# Patient Record
Sex: Female | Born: 1965 | Race: White | Hispanic: No | Marital: Married | Smoking: Former smoker
Health system: Southern US, Community
[De-identification: ages and names within clinical notes are randomized; demographics above are authoritative.]

## PROBLEM LIST (undated history)

## (undated) DIAGNOSIS — G43909 Migraine, unspecified, not intractable, without status migrainosus: Secondary | ICD-10-CM

## (undated) DIAGNOSIS — R519 Headache, unspecified: Secondary | ICD-10-CM

## (undated) DIAGNOSIS — K219 Gastro-esophageal reflux disease without esophagitis: Secondary | ICD-10-CM

## (undated) DIAGNOSIS — R51 Headache: Secondary | ICD-10-CM

## (undated) DIAGNOSIS — C50411 Malignant neoplasm of upper-outer quadrant of right female breast: Secondary | ICD-10-CM

## (undated) DIAGNOSIS — I10 Essential (primary) hypertension: Secondary | ICD-10-CM

## (undated) HISTORY — PX: WISDOM TOOTH EXTRACTION: SHX21

## (undated) HISTORY — PX: TUBAL LIGATION: SHX77

## (undated) HISTORY — DX: Migraine, unspecified, not intractable, without status migrainosus: G43.909

## (undated) HISTORY — DX: Malignant neoplasm of upper-outer quadrant of right female breast: C50.411

---

## 2009-11-11 HISTORY — PX: CHOLECYSTECTOMY: SHX55

## 2015-11-12 HISTORY — PX: HERNIA REPAIR: SHX51

## 2016-05-01 ENCOUNTER — Other Ambulatory Visit: Payer: Self-pay | Admitting: General Surgery

## 2016-05-01 DIAGNOSIS — C50211 Malignant neoplasm of upper-inner quadrant of right female breast: Secondary | ICD-10-CM

## 2016-05-03 ENCOUNTER — Encounter (HOSPITAL_COMMUNITY): Payer: Self-pay | Admitting: Oncology

## 2016-05-03 ENCOUNTER — Other Ambulatory Visit: Payer: Self-pay | Admitting: General Surgery

## 2016-05-03 DIAGNOSIS — C50411 Malignant neoplasm of upper-outer quadrant of right female breast: Secondary | ICD-10-CM

## 2016-05-03 DIAGNOSIS — C50211 Malignant neoplasm of upper-inner quadrant of right female breast: Secondary | ICD-10-CM | POA: Insufficient documentation

## 2016-05-03 HISTORY — DX: Malignant neoplasm of upper-outer quadrant of right female breast: C50.411

## 2016-05-07 ENCOUNTER — Encounter (HOSPITAL_BASED_OUTPATIENT_CLINIC_OR_DEPARTMENT_OTHER): Payer: Self-pay | Admitting: *Deleted

## 2016-05-09 ENCOUNTER — Other Ambulatory Visit: Payer: Self-pay | Admitting: Radiology

## 2016-05-10 NOTE — Pre-Procedure Instructions (Signed)
Boost Breeze given to patients family member with instructions.

## 2016-05-13 ENCOUNTER — Encounter (HOSPITAL_COMMUNITY)
Admission: RE | Admit: 2016-05-13 | Discharge: 2016-05-13 | Disposition: A | Discharge status: Home / Self Care | Payer: BLUE CROSS/BLUE SHIELD | Source: Ambulatory Visit | Attending: General Surgery | Admitting: General Surgery

## 2016-05-13 ENCOUNTER — Encounter (HOSPITAL_COMMUNITY): Payer: BLUE CROSS/BLUE SHIELD | Attending: Hematology & Oncology | Admitting: Hematology & Oncology

## 2016-05-13 VITALS — BP 156/82 | HR 71 | Temp 98.4°F | Resp 16 | Wt 198.0 lb

## 2016-05-13 DIAGNOSIS — Z170 Estrogen receptor positive status [ER+]: Secondary | ICD-10-CM | POA: Diagnosis not present

## 2016-05-13 DIAGNOSIS — C50211 Malignant neoplasm of upper-inner quadrant of right female breast: Secondary | ICD-10-CM

## 2016-05-13 DIAGNOSIS — C50911 Malignant neoplasm of unspecified site of right female breast: Secondary | ICD-10-CM

## 2016-05-13 NOTE — Patient Instructions (Signed)
Rachael Villa at [facility] Discharge Instructions  RECOMMENDATIONS MADE BY THE CONSULTANT AND ANY TEST RESULTS WILL BE SENT TO YOUR REFERRING PHYSICIAN.  Exam done and seen today by Dr. Whitney Muse Return to see the doctor in about three weeks Please call the clinic if you have any questions or concerns  Thank you for choosing [place] at [facility] to provide your oncology and hematology care.  To afford each patient quality time with our provider, please arrive at least 15 minutes before your scheduled appointment time.   Beginning January 23rd 2017 lab work for the [facility] will be done in the  Main lab at [facility] on 1st floor. If you have a lab appointment with the [facility] please come in thru the  Main Entrance and check in at the main information desk  You need to re-schedule your appointment should you arrive 10 or more minutes late.  We strive to give you quality time with our providers, and arriving late affects you and other patients whose appointments are after yours.  Also, if you no show three or more times for appointments you may be dismissed from the clinic at the providers discretion.     Again, thank you for choosing [facility].  Our hope is that these requests will decrease the amount of time that you wait before being seen by our physicians.       _____________________________________________________________  Should you have questions after your visit to [facility], please contact our office at [phone] between the hours of 8:30 a.m. and 4:30 p.m.  Voicemails left after 4:30 p.m. will not be returned until the following business day.  For prescription refill requests, have your pharmacy contact our office.         Resources For Cancer Patients and their Caregivers ? American Cancer Society: Can assist with transportation, wigs, general needs, runs Look Good Feel Better.         539-780-6708 ? Cancer Care: Provides financial assistance, online support groups, medication/co-pay assistance.  1-800-813-HOPE 236-221-8035) ? [facility] Assists [place] cancer patients and their families through emotional , educational and financial support.  (207) 257-6112 ? [place] DSS Where to apply for food stamps, Medicaid and utility assistance. 905-852-8481 ? [employer]: Transportation to medical appointments. 616-758-6165 ? Social Security Administration: May apply for disability if have a Stage IV cancer. 515-888-9367 810-539-3048 ? [employer], Disability and Transit Services: Assists with nutrition, care and transit needs. [place] Support Programs: @10RELATIVEDAYS @ > Cancer Support Group  2nd Tuesday of the month 1pm-2pm, Journey Room  > Creative Journey  3rd Tuesday of the month 1130am-1pm, Journey Room  > Look Good Feel Better  1st Wednesday of the month 10am-12 noon, Journey Room (Call [place] to register [phone])

## 2016-05-13 NOTE — Progress Notes (Signed)
[facility]  CONSULT NOTE  Patient Care Team: No Pcp Per Patient as PCP - General (General Practice)  CHIEF COMPLAINTS/PURPOSE OF CONSULTATION:  Right breast cancer, ER+ PR+ HER-2(-)     Breast cancer of upper-outer quadrant of right female breast ([place])   03/28/2016 Mammogram 1.9 cm irregular spiculated mass in the upper inner quadrant of right breast.   04/19/2016 Pathology Results [facility] [facility] pathology-infiltrating ductal carcinoma of right breast.   05/03/2016 Pathology Results [facility] pathology consult- Consult [place] , right breast biopsy - INVASIVE DUCTAL CARCINOMA. ER (95%, strong staining intensity) and PR (95%, strong staining intensity). Her2 is negative (1+). Ki-67 reveals a proliferation rate of 30% and p53 is negative.    HISTORY OF PRESENTING ILLNESS:  Rachael Villa 50 y.o. female is here because of Right breast cancer, ER+ PR+ HER-2(-).  [name] is accompanied by her mother. I personally reviewed and went over pathology results with the patient.   She is scheduled for Right breast lumpectomy and sentinel node biopsy with Dr. Donne Hazel on Wednesday, 7/5. She is very happy with her experience with Dr. Donne Hazel.   She did not self palpate the mass in her breast, it was found during her screening mammogram. She is up to date on pap smears. She is pre-menopausal with erratic periods. There is no family history of breast cancer that she is aware of.  States her anxiety has all come from the numbing after having her biopsy done in [place]. Stating that it was very uncomfortable. When she had a biopsy here it was much better. Her only anxiety left is knowing they'll have to apply the numbing agent again, although she is not nervous about the surgery as a whole.   She denies any problems at her biopsy site, just a little tender.   Reports abdominal tenderness from previous hernia surgery.   She does not recall if her physician spoke with her about genetic testing.    She has already scheduled a consultation visit with radiation oncology in [place] on 7/18.  She will be due for a colonoscopy soon. Her last colonoscopy did not find any polyps.  The patient is here for further evaluation and discussion of newly diagnosed Right breast cancer and additional treatment options.  MEDICAL HISTORY:  Past Medical History  Diagnosis Date  . Breast cancer of upper-outer quadrant of right female breast ([place]) 05/03/2016  . Hypertension   . Headache     while on BCP  . GERD (gastroesophageal reflux disease)     less since gallbladder removed    SURGICAL HISTORY: Past Surgical History  Procedure Laterality Date  . Cholecystectomy  2011  . Hernia repair  2017    UHR  . Wisdom tooth extraction    . Tubal ligation      SOCIAL HISTORY: Social History   Social History  . Marital Status: Married    Spouse Name: N/A  . Number of Children: N/A  . Years of Education: N/A   Occupational History  . Not on file.   Social History Main Topics  . Smoking status: Former Research scientist (life sciences)  . Smokeless tobacco: Not on file  . Alcohol Use: Yes     Comment: social  . Drug Use: No  . Sexual Activity: Yes    Birth Control/ Protection: Surgical     Comment: TL   Other Topics Concern  . 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  Mother is still living and mostly healthy. Father deceased at 35 yo of colon cancer and related issues.  1 sibling No other history of breast cancer in the family that she is aware of.   ALLERGIES:  has No Known Allergies.  MEDICATIONS:  Current Outpatient Prescriptions  Medication Sig Dispense Refill  . cetirizine (ZYRTEC) 10 MG tablet Take 10 mg by  mouth daily.    Marland Kitchen losartan (COZAAR) 50 MG tablet Take 50 mg by mouth daily.    . Probiotic Product (PROBIOTIC ADVANCED PO) Take by mouth.     No current facility-administered medications for this visit.    Review of Systems  Constitutional: Negative.   HENT: Negative.   Eyes: Negative.   Respiratory: Negative.   Cardiovascular: Negative.   Gastrointestinal: Positive for abdominal pain (secondary to hernia surgery).  Genitourinary: Negative.   Musculoskeletal: Negative.   Skin: Negative.   Neurological: Negative.   Endo/Heme/Allergies: Negative.   Psychiatric/Behavioral: Negative.   All other systems reviewed and are negative. 14 point ROS was done and is otherwise as detailed above or in HPI   PHYSICAL EXAMINATION: ECOG PERFORMANCE STATUS: 1 - Symptomatic but completely ambulatory  Filed Vitals:   05/13/16 1351  BP: 156/82  Pulse: 71  Temp: 98.4 F (36.9 C)  Resp: 16   Filed Weights   05/13/16 1351  Weight: 198 lb (89.812 kg)     Physical Exam  Constitutional: She is oriented to person, place, and time and well-developed, well-nourished, and in no distress.  HENT:  Head: Normocephalic and atraumatic.  Nose: Nose normal.  Mouth/Throat: Oropharynx is clear and moist. No oropharyngeal exudate.  Eyes: Conjunctivae and EOM are normal. Pupils are equal, round, and reactive to light. Right eye exhibits no discharge. Left eye exhibits no discharge. No scleral icterus.  Neck: Normal range of motion. Neck supple. No tracheal deviation present. No thyromegaly present.  Cardiovascular: Normal rate, regular rhythm and normal heart sounds.  Exam reveals no gallop and no friction rub.   No murmur heard. Pulmonary/Chest: Effort normal and breath sounds normal. She has no wheezes. She has no rales.    Abdominal: Soft. Bowel sounds are normal. She exhibits no distension and no mass. There is tenderness. There is no rebound and no guarding.  Midline incision above the umbilicus -  tender with palpation  Musculoskeletal: Normal range of motion. She exhibits no edema.  Lymphadenopathy:    She has no cervical adenopathy.  Neurological: She is alert and oriented to person, place, and time. She has normal reflexes. No cranial nerve deficit. Gait normal. Coordination normal.  Skin: Skin is warm and dry. No rash noted.  Psychiatric: Mood, memory, affect and judgment normal.  Nursing note and vitals reviewed.   LABORATORY DATA:  I have reviewed the data as listed No results found for: WBC, HGB, HCT, MCV, PLT CMP  No results found for: NA, K, CL, CO2, GLUCOSE, BUN, CREATININE, CALCIUM, PROT, ALBUMIN, AST, ALT, ALKPHOS, BILITOT, GFRNONAA, GFRAA   RADIOGRAPHIC STUDIES: I have personally reviewed the radiological images as listed and agreed with the findings in the report. No results found.  PATHOLOGY   ASSESSMENT & PLAN:  Right breast cancer, ER+ PR+ HER-2(-)  Pre-Menopausal  The patient  is here for further evaluation and discussion of newly diagnosed right breast cancer and treatment options.I personally reviewed and went over pathology results at length with the patient. The patient was given an NCCN guidelines informational booklet about breast cancer. I reviewed this booklet with the patient and her mother. We discussed ER+ PR+ breast cancer in detail. HER 2 is pending. I spent time addressing the patient's and her mothers questions regarding staging and prognosis. We discussed the importance of final surgical pathology.  She is pre-menopausal with erratic periods. We briefly addressed the use of Tamoxifen as initial endocrine therapy.   I will send for oncotype testing once surgical pathology is available.   She is scheduled for Right breast lumpectomy and sentinel node biopsy with Dr. Donne Hazel on Wednesday, 7/5. She has already scheduled a consultation visit with radiation oncology in [place] on 7/18.  She will return for follow up on 7/26, 3 weeks after her  lumpectomy. She was instructed to call with any questions or concerns prior to scheduled follow-up.  All questions were answered. The patient knows to call the clinic with any problems, questions or concerns.  This document serves as a record of services personally performed by Ancil Linsey, MD. It was created on her behalf by Arlyce Harman, a trained medical scribe. The creation of this record is based on the scribe's personal observations and the provider's statements to them. This document has been checked and approved by the attending provider.  I have reviewed the above documentation for accuracy and completeness, and I agree with the above.  This note was electronically signed.    Molli Hazard, MD  05/13/2016 2:30 PM

## 2016-05-15 ENCOUNTER — Encounter (HOSPITAL_BASED_OUTPATIENT_CLINIC_OR_DEPARTMENT_OTHER): Payer: Self-pay | Admitting: *Deleted

## 2016-05-15 ENCOUNTER — Encounter: Payer: Self-pay | Admitting: Hematology & Oncology

## 2016-05-15 ENCOUNTER — Ambulatory Visit (HOSPITAL_BASED_OUTPATIENT_CLINIC_OR_DEPARTMENT_OTHER): Payer: BLUE CROSS/BLUE SHIELD | Admitting: Anesthesiology

## 2016-05-15 ENCOUNTER — Encounter (HOSPITAL_BASED_OUTPATIENT_CLINIC_OR_DEPARTMENT_OTHER)
Admission: RE | Disposition: A | Discharge status: Home / Self Care | Payer: Self-pay | Source: Ambulatory Visit | Attending: General Surgery

## 2016-05-15 ENCOUNTER — Encounter (HOSPITAL_BASED_OUTPATIENT_CLINIC_OR_DEPARTMENT_OTHER)
Admission: RE | Admit: 2016-05-15 | Discharge: 2016-05-15 | Disposition: A | Discharge status: Home / Self Care | Payer: BLUE CROSS/BLUE SHIELD | Source: Ambulatory Visit | Attending: General Surgery | Admitting: General Surgery

## 2016-05-15 ENCOUNTER — Ambulatory Visit (HOSPITAL_BASED_OUTPATIENT_CLINIC_OR_DEPARTMENT_OTHER)
Admission: RE | Admit: 2016-05-15 | Discharge: 2016-05-15 | Disposition: A | Discharge status: Home / Self Care | Payer: BLUE CROSS/BLUE SHIELD | Source: Ambulatory Visit | Attending: General Surgery | Admitting: General Surgery

## 2016-05-15 DIAGNOSIS — N6011 Diffuse cystic mastopathy of right breast: Secondary | ICD-10-CM | POA: Diagnosis not present

## 2016-05-15 DIAGNOSIS — C50211 Malignant neoplasm of upper-inner quadrant of right female breast: Secondary | ICD-10-CM | POA: Diagnosis not present

## 2016-05-15 DIAGNOSIS — C50911 Malignant neoplasm of unspecified site of right female breast: Secondary | ICD-10-CM | POA: Insufficient documentation

## 2016-05-15 DIAGNOSIS — C773 Secondary and unspecified malignant neoplasm of axilla and upper limb lymph nodes: Secondary | ICD-10-CM | POA: Insufficient documentation

## 2016-05-15 HISTORY — DX: Headache, unspecified: R51.9

## 2016-05-15 HISTORY — DX: Gastro-esophageal reflux disease without esophagitis: K21.9

## 2016-05-15 HISTORY — DX: Headache: R51

## 2016-05-15 HISTORY — DX: Essential (primary) hypertension: I10

## 2016-05-15 HISTORY — PX: RADIOACTIVE SEED GUIDED PARTIAL MASTECTOMY WITH AXILLARY SENTINEL LYMPH NODE BIOPSY: SHX6520

## 2016-05-15 SURGERY — RADIOACTIVE SEED GUIDED PARTIAL MASTECTOMY WITH AXILLARY SENTINEL LYMPH NODE BIOPSY
Anesthesia: General | Site: Breast | Laterality: Right

## 2016-05-15 MED ORDER — METOCLOPRAMIDE HCL 5 MG/ML IJ SOLN
10.0000 mg | Freq: Once | INTRAMUSCULAR | Status: DC | PRN
Start: 2016-05-15 — End: 2016-05-15

## 2016-05-15 MED ORDER — HYDROCODONE-ACETAMINOPHEN 10-325 MG PO TABS: 1.0000 | ORAL_TABLET | Freq: Four times a day (QID) | ORAL | Status: AC | PRN

## 2016-05-15 MED ORDER — SODIUM CHLORIDE 0.9 % IJ SOLN
INTRAMUSCULAR | Status: AC
  Filled 2016-05-15: qty 10

## 2016-05-15 MED ORDER — DEXAMETHASONE SODIUM PHOSPHATE 10 MG/ML IJ SOLN
INTRAMUSCULAR | Status: AC
  Filled 2016-05-15: qty 1

## 2016-05-15 MED ORDER — ACETAMINOPHEN 500 MG PO TABS
ORAL_TABLET | ORAL | Status: AC
  Filled 2016-05-15: qty 2

## 2016-05-15 MED ORDER — LIDOCAINE HCL (CARDIAC) 20 MG/ML IV SOLN
INTRAVENOUS | Status: DC | PRN
  Administered 2016-05-15: 70 mg via INTRAVENOUS

## 2016-05-15 MED ORDER — FENTANYL CITRATE (PF) 100 MCG/2ML IJ SOLN
25.0000 ug | INTRAMUSCULAR | Status: DC | PRN
  Administered 2016-05-15: 50 ug via INTRAVENOUS

## 2016-05-15 MED ORDER — CEFAZOLIN SODIUM-DEXTROSE 2-4 GM/100ML-% IV SOLN
2.0000 g | INTRAVENOUS | Status: AC
  Administered 2016-05-15: 2 g via INTRAVENOUS

## 2016-05-15 MED ORDER — SCOPOLAMINE 1 MG/3DAYS TD PT72: 1.0000 | MEDICATED_PATCH | Freq: Once | TRANSDERMAL | Status: DC | PRN

## 2016-05-15 MED ORDER — MIDAZOLAM HCL 2 MG/2ML IJ SOLN
INTRAMUSCULAR | Status: AC
  Filled 2016-05-15: qty 2

## 2016-05-15 MED ORDER — FENTANYL CITRATE (PF) 100 MCG/2ML IJ SOLN
INTRAMUSCULAR | Status: AC
  Filled 2016-05-15: qty 2

## 2016-05-15 MED ORDER — GABAPENTIN 300 MG PO CAPS
300.0000 mg | ORAL_CAPSULE | Freq: Once | ORAL | Status: AC
  Administered 2016-05-15: 300 mg via ORAL

## 2016-05-15 MED ORDER — ACETAMINOPHEN 500 MG PO TABS
1000.0000 mg | ORAL_TABLET | Freq: Once | ORAL | Status: AC
  Administered 2016-05-15: 1000 mg via ORAL

## 2016-05-15 MED ORDER — HEMOSTATIC AGENTS (NO CHARGE) OPTIME
TOPICAL | Status: DC | PRN
  Administered 2016-05-15: 1 via TOPICAL

## 2016-05-15 MED ORDER — LACTATED RINGERS IV SOLN
INTRAVENOUS | Status: DC
  Administered 2016-05-15: 15:00:00 via INTRAVENOUS

## 2016-05-15 MED ORDER — PROPOFOL 10 MG/ML IV BOLUS
INTRAVENOUS | Status: AC
  Filled 2016-05-15: qty 20

## 2016-05-15 MED ORDER — CEFAZOLIN SODIUM-DEXTROSE 2-4 GM/100ML-% IV SOLN
INTRAVENOUS | Status: AC
  Filled 2016-05-15: qty 100

## 2016-05-15 MED ORDER — GABAPENTIN 300 MG PO CAPS
ORAL_CAPSULE | ORAL | Status: AC
  Filled 2016-05-15: qty 1

## 2016-05-15 MED ORDER — BUPIVACAINE-EPINEPHRINE (PF) 0.5% -1:200000 IJ SOLN
INTRAMUSCULAR | Status: DC | PRN
  Administered 2016-05-15: 30 mL

## 2016-05-15 MED ORDER — CELECOXIB 400 MG PO CAPS
400.0000 mg | ORAL_CAPSULE | Freq: Once | ORAL | Status: AC
  Administered 2016-05-15: 400 mg via ORAL

## 2016-05-15 MED ORDER — BUPIVACAINE HCL (PF) 0.25 % IJ SOLN
INTRAMUSCULAR | Status: DC | PRN
  Administered 2016-05-15: 8 mL

## 2016-05-15 MED ORDER — MEPERIDINE HCL 25 MG/ML IJ SOLN: 6.2500 mg | INTRAMUSCULAR | Status: DC | PRN

## 2016-05-15 MED ORDER — GLYCOPYRROLATE 0.2 MG/ML IJ SOLN: 0.2000 mg | Freq: Once | INTRAMUSCULAR | Status: DC | PRN

## 2016-05-15 MED ORDER — TECHNETIUM TC 99M SULFUR COLLOID FILTERED
1.0000 | Freq: Once | INTRAVENOUS | Status: AC | PRN
  Administered 2016-05-15: 1 via INTRADERMAL

## 2016-05-15 MED ORDER — LACTATED RINGERS IV SOLN: INTRAVENOUS | Status: DC

## 2016-05-15 MED ORDER — MIDAZOLAM HCL 2 MG/2ML IJ SOLN
1.0000 mg | INTRAMUSCULAR | Status: DC | PRN
  Administered 2016-05-15 (×2): 2 mg via INTRAVENOUS

## 2016-05-15 MED ORDER — PROPOFOL 10 MG/ML IV BOLUS
INTRAVENOUS | Status: DC | PRN
  Administered 2016-05-15: 200 mg via INTRAVENOUS
  Administered 2016-05-15: 30 mg via INTRAVENOUS

## 2016-05-15 MED ORDER — ONDANSETRON HCL 4 MG/2ML IJ SOLN
INTRAMUSCULAR | Status: DC | PRN
  Administered 2016-05-15: 4 mg via INTRAVENOUS

## 2016-05-15 MED ORDER — LIDOCAINE 2% (20 MG/ML) 5 ML SYRINGE
INTRAMUSCULAR | Status: AC
  Filled 2016-05-15: qty 5

## 2016-05-15 MED ORDER — DEXAMETHASONE SODIUM PHOSPHATE 4 MG/ML IJ SOLN
INTRAMUSCULAR | Status: DC | PRN
  Administered 2016-05-15: 10 mg via INTRAVENOUS

## 2016-05-15 MED ORDER — FENTANYL CITRATE (PF) 100 MCG/2ML IJ SOLN
50.0000 ug | INTRAMUSCULAR | Status: AC | PRN
  Administered 2016-05-15: 50 ug via INTRAVENOUS
  Administered 2016-05-15: 100 ug via INTRAVENOUS
  Administered 2016-05-15: 50 ug via INTRAVENOUS
  Administered 2016-05-15: 100 ug via INTRAVENOUS

## 2016-05-15 SURGICAL SUPPLY — 60 items
BINDER BREAST LRG (GAUZE/BANDAGES/DRESSINGS) IMPLANT
BINDER BREAST MEDIUM (GAUZE/BANDAGES/DRESSINGS) IMPLANT
BINDER BREAST XLRG (GAUZE/BANDAGES/DRESSINGS) ×3 IMPLANT
BINDER BREAST XXLRG (GAUZE/BANDAGES/DRESSINGS) IMPLANT
BLADE SURG 15 STRL LF DISP TIS (BLADE) ×1 IMPLANT
BLADE SURG 15 STRL SS (BLADE) ×2
CANISTER SUC SOCK COL 7IN (MISCELLANEOUS) IMPLANT
CANISTER SUCT 1200ML W/VALVE (MISCELLANEOUS) IMPLANT
CHLORAPREP W/TINT 26ML (MISCELLANEOUS) ×3 IMPLANT
CLIP TI WIDE RED SMALL 6 (CLIP) ×3 IMPLANT
CLOSURE WOUND 1/2 X4 (GAUZE/BANDAGES/DRESSINGS) ×1
COVER BACK TABLE 60X90IN (DRAPES) ×3 IMPLANT
COVER MAYO STAND STRL (DRAPES) ×3 IMPLANT
COVER PROBE W GEL 5X96 (DRAPES) ×3 IMPLANT
DECANTER SPIKE VIAL GLASS SM (MISCELLANEOUS) IMPLANT
DEVICE DUBIN W/COMP PLATE 8390 (MISCELLANEOUS) ×3 IMPLANT
DRAPE LAPAROSCOPIC ABDOMINAL (DRAPES) ×3 IMPLANT
DRAPE UTILITY XL STRL (DRAPES) ×3 IMPLANT
ELECT COATED BLADE 2.86 ST (ELECTRODE) ×3 IMPLANT
ELECT REM PT RETURN 9FT ADLT (ELECTROSURGICAL) ×3
ELECTRODE REM PT RTRN 9FT ADLT (ELECTROSURGICAL) ×1 IMPLANT
GLOVE BIO SURGEON STRL SZ7 (GLOVE) ×9 IMPLANT
GLOVE BIOGEL PI IND STRL 7.0 (GLOVE) ×1 IMPLANT
GLOVE BIOGEL PI IND STRL 7.5 (GLOVE) ×2 IMPLANT
GLOVE BIOGEL PI INDICATOR 7.0 (GLOVE) ×2
GLOVE BIOGEL PI INDICATOR 7.5 (GLOVE) ×4
GLOVE SURG SS PI 7.0 STRL IVOR (GLOVE) ×3 IMPLANT
GOWN STRL REUS W/ TWL LRG LVL3 (GOWN DISPOSABLE) ×3 IMPLANT
GOWN STRL REUS W/TWL LRG LVL3 (GOWN DISPOSABLE) ×6
HEMOSTAT ARISTA ABSORB 3G PWDR (MISCELLANEOUS) ×3 IMPLANT
ILLUMINATOR WAVEGUIDE N/F (MISCELLANEOUS) ×3 IMPLANT
KIT MARKER MARGIN INK (KITS) ×3 IMPLANT
LIGHT WAVEGUIDE WIDE FLAT (MISCELLANEOUS) IMPLANT
LIQUID BAND (GAUZE/BANDAGES/DRESSINGS) ×3 IMPLANT
NDL SAFETY ECLIPSE 18X1.5 (NEEDLE) IMPLANT
NEEDLE HYPO 18GX1.5 SHARP (NEEDLE)
NEEDLE HYPO 25X1 1.5 SAFETY (NEEDLE) ×3 IMPLANT
NS IRRIG 1000ML POUR BTL (IV SOLUTION) ×3 IMPLANT
PACK BASIN DAY SURGERY FS (CUSTOM PROCEDURE TRAY) ×3 IMPLANT
PENCIL BUTTON HOLSTER BLD 10FT (ELECTRODE) ×3 IMPLANT
SHEET MEDIUM DRAPE 40X70 STRL (DRAPES) IMPLANT
SLEEVE SCD COMPRESS KNEE MED (MISCELLANEOUS) ×3 IMPLANT
SPONGE LAP 4X18 X RAY DECT (DISPOSABLE) ×3 IMPLANT
STOCKINETTE IMPERVIOUS LG (DRAPES) IMPLANT
STRIP CLOSURE SKIN 1/2X4 (GAUZE/BANDAGES/DRESSINGS) ×2 IMPLANT
SUT ETHILON 2 0 FS 18 (SUTURE) IMPLANT
SUT MNCRL AB 4-0 PS2 18 (SUTURE) ×3 IMPLANT
SUT MON AB 5-0 PS2 18 (SUTURE) IMPLANT
SUT SILK 2 0 SH (SUTURE) ×3 IMPLANT
SUT VIC AB 2-0 SH 27 (SUTURE) ×2
SUT VIC AB 2-0 SH 27XBRD (SUTURE) ×1 IMPLANT
SUT VIC AB 3-0 SH 27 (SUTURE) ×2
SUT VIC AB 3-0 SH 27X BRD (SUTURE) ×1 IMPLANT
SUT VIC AB 5-0 PS2 18 (SUTURE) IMPLANT
SYR CONTROL 10ML LL (SYRINGE) ×3 IMPLANT
TOWEL OR 17X24 6PK STRL BLUE (TOWEL DISPOSABLE) ×3 IMPLANT
TOWEL OR NON WOVEN STRL DISP B (DISPOSABLE) ×3 IMPLANT
TUBE CONNECTING 20'X1/4 (TUBING)
TUBE CONNECTING 20X1/4 (TUBING) IMPLANT
YANKAUER SUCT BULB TIP NO VENT (SUCTIONS) IMPLANT

## 2016-05-15 NOTE — Interval H&P Note (Signed)
History and Physical Interval Note:  05/15/2016 2:38 PM  Rachael Villa  has presented today for surgery, with the diagnosis of RIGHT BREAST CANCER  The various methods of treatment have been discussed with the patient and family. After consideration of risks, benefits and other options for treatment, the patient has consented to  Procedure(s): RADIOACTIVE SEED GUIDED RIGHT BREAST LUMPECTOMY WITH AXILLARY SENTINEL LYMPH NODE BIOPSY (Right) as a surgical intervention .  The patient's history has been reviewed, patient examined, no change in status, stable for surgery.  I have reviewed the patient's chart and labs.  Questions were answered to the patient's satisfaction.     Trenese Haft

## 2016-05-15 NOTE — Anesthesia Preprocedure Evaluation (Addendum)
Anesthesia Evaluation  Patient identified by MRN, date of birth, ID band Patient awake    Reviewed: Allergy & Precautions, NPO status , Patient's Chart, lab work & pertinent test results  Airway Mallampati: II  TM Distance: >3 FB Neck ROM: Full    Dental no notable dental hx.    Pulmonary former smoker,    Pulmonary exam normal breath sounds clear to auscultation       Cardiovascular hypertension, Pt. on medications Normal cardiovascular exam Rhythm:Regular Rate:Normal     Neuro/Psych negative neurological ROS  negative psych ROS   GI/Hepatic negative GI ROS, Neg liver ROS,   Endo/Other  negative endocrine ROS  Renal/GU negative Renal ROS  negative genitourinary   Musculoskeletal negative musculoskeletal ROS (+)   Abdominal   Peds negative pediatric ROS (+)  Hematology negative hematology ROS (+)   Anesthesia Other Findings   Reproductive/Obstetrics negative OB ROS                            Anesthesia Physical Anesthesia Plan  ASA: II  Anesthesia Plan: General   Post-op Pain Management: GA combined w/ Regional for post-op pain   Induction: Intravenous  Airway Management Planned: LMA  Additional Equipment:   Intra-op Plan:   Post-operative Plan: Extubation in OR  Informed Consent: I have reviewed the patients History and Physical, chart, labs and discussed the procedure including the risks, benefits and alternatives for the proposed anesthesia with the patient or authorized representative who has indicated his/her understanding and acceptance.   Dental advisory given  Plan Discussed with: CRNA  Anesthesia Plan Comments: (PEC block)        Anesthesia Quick Evaluation

## 2016-05-15 NOTE — Progress Notes (Signed)
Assisted Dr. Carignan with right, ultrasound guided, pectoralis block. Side rails up, monitors on throughout procedure. See vital signs in flow sheet. Tolerated Procedure well. 

## 2016-05-15 NOTE — Anesthesia Procedure Notes (Addendum)
Anesthesia Regional Block:  Pectoralis block  Pre-Anesthetic Checklist: ,, timeout performed, Correct Patient, Correct Site, Correct Laterality, Correct Procedure, Correct Position, site marked, Risks and benefits discussed,  Surgical consent,  Pre-op evaluation,  At surgeon's request and post-op pain management  Laterality: Right  Prep: Maximum Sterile Barrier Precautions used and chloraprep       Needles:  Injection technique: Single-shot  Needle Type: Echogenic Stimulator Needle     Needle Length: 10cm 10 cm Needle Gauge: 21 and 21 G    Additional Needles:  Procedures: ultrasound guided (picture in chart) Pectoralis block Narrative:  Injection made incrementally with aspirations every 5 mL.  Performed by: Personally   Additional Notes: Risks, benefits and alternative to block explained extensively.  Patient tolerated procedure well, without complications.   Procedure Name: LMA Insertion Date/Time: 05/15/2016 2:57 PM Performed by: Maryella Shivers Pre-anesthesia Checklist: Patient identified, Emergency Drugs available, Suction available and Patient being monitored Patient Re-evaluated:Patient Re-evaluated prior to inductionOxygen Delivery Method: Circle system utilized Preoxygenation: Pre-oxygenation with 100% oxygen Intubation Type: IV induction Ventilation: Mask ventilation without difficulty LMA: LMA inserted LMA Size: 4.0 Number of attempts: 1 Airway Equipment and Method: Bite block Placement Confirmation: positive ETCO2 Tube secured with: Tape Dental Injury: Teeth and Oropharynx as per pre-operative assessment

## 2016-05-15 NOTE — H&P (Signed)
50 yof who presents today with newly diagnosed right breast cancer. she missed a couple years of mm. she has no prior breast history and has no family history of breast or ovarian cancer. she underwent mm that shows heterogenously dense breasts that shows an irregular 1.9 cm spiculated mass in the medial right breast about 5 cm from the nipple. there is also a well circumscribed 2.6 cm mass in the central breast that was been present since 2014. US shows 2 adjacent cysts accounting for the stable mass. there is not [place] correlate for spiculated mass. the left breast is normal she has no mass or discharge noted. a core biopsy in [place] was performed that shows an IDC grade 2 that is er/pr positive, her2 negative and Ki is 30%. She is here to discuss options. Since then she has undergone [place] here that shows 2 cm upper inner quadrant mass. She also had single abnormal node that was biopsied (focal cortical thickening) and is concordant and benign.   Other Problems Marjean Donna, CMA; 05/01/2016 2:49 PM) Anxiety Disorder Back Pain Cholelithiasis Diverticulosis Gastroesophageal Reflux Disease Hemorrhoids High blood pressure Migraine Headache Pancreatitis Umbilical Hernia Repair  Past Surgical History Marjean Donna, CMA; 05/01/2016 2:49 PM) Breast Biopsy Right. Gallbladder Surgery - Laparoscopic Oral Surgery Ventral / Umbilical Hernia Surgery Bilateral.  Diagnostic Studies History Marjean Donna, CMA; 05/01/2016 2:49 PM) Colonoscopy 5-10 years ago Mammogram within last year Pap Smear 1-5 years ago  Allergies Marjean Donna, [place]; 05/01/2016 2:50 PM) No Known Drug Allergies06/21/2017  Medication History (Sonya Bynum, CMA; 05/01/2016 2:50 PM) Losartan Potassium-HCTZ (50-12.5MG Tablet, Oral) Active. Medications Reconciled  Social History Marjean Donna, CMA; 05/01/2016 2:49 PM) Alcohol use Occasional alcohol use. Caffeine use Carbonated beverages. No drug use Tobacco  use Former smoker.  Family History Marjean Donna, [place]; 05/01/2016 2:49 PM) Alcohol Abuse Father. Colon Cancer Father. Diabetes Mellitus Mother. Heart disease in female family member before age 9 Hypertension Father, Mother.  Pregnancy / Birth History Marjean Donna, [place]; 05/01/2016 2:49 PM) Age at menarche 73 years. Contraceptive History Oral contraceptives. Gravida 4 Irregular periods Maternal age 1-35 Para 2  Review of Systems ([place]; 05/01/2016 2:49 PM) General Present- Night Sweats. Not Present- Appetite Loss, Chills, Fatigue, Fever, Weight Gain and Weight Loss. Skin Not Present- Change in Wart/Mole, Dryness, Hives, Jaundice, New Lesions, Non-Healing Wounds, Rash and Ulcer. HEENT Present- Seasonal Allergies and Wears glasses/contact lenses. Not Present- Earache, Hearing Loss, Hoarseness, Nose Bleed, Oral Ulcers, Ringing in the Ears, Sinus Pain, Sore Throat, Visual Disturbances and Yellow Eyes. Respiratory Present- Snoring. Not Present- Bloody sputum, Chronic Cough, Difficulty Breathing and Wheezing. Breast Not Present- Breast Mass, Breast Pain, Nipple Discharge and Skin Changes. Cardiovascular Not Present- Chest Pain, Difficulty Breathing Lying Down, Leg Cramps, Palpitations, Rapid Heart Rate, Shortness of Breath and Swelling of Extremities. Gastrointestinal Present- Abdominal Pain and Hemorrhoids. Not Present- Bloating, Bloody Stool, Change in Bowel Habits, Chronic diarrhea, Constipation, Difficulty Swallowing, Excessive gas, Gets full quickly at meals, Indigestion, Nausea, Rectal Pain and Vomiting. Female Genitourinary Present- Frequency. Not Present- Nocturia, Painful Urination, Pelvic Pain and Urgency. Musculoskeletal Not Present- Back Pain, Joint Pain, Joint Stiffness, Muscle Pain, Muscle Weakness and Swelling of Extremities. Neurological Not Present- Decreased Memory, Fainting, Headaches, Numbness, Seizures, Tingling, Tremor, Trouble walking and  Weakness. Psychiatric Present- Anxiety. Not Present- Bipolar, Change in Sleep Pattern, Depression, Fearful and Frequent crying. Endocrine Present- Hot flashes. Not Present- Cold Intolerance, Excessive Hunger, Hair Changes, Heat Intolerance and New Diabetes.  Vitals Davy Pique Bynum CMA; 05/01/2016  2:50 PM) 05/01/2016 2:49 PM Weight: 198 lb Height: 66in Body Surface Area: 1.99 m Body Mass Index: 31.96 kg/m  Temp.: 73F(Temporal)  Pulse: 75 (Regular)  BP: 126/80 (Sitting, Left Arm, Standard) Physical Exam Rolm Bookbinder MD; 05/01/2016 9:45 PM) General Mental Status-Alert. Orientation-Oriented X3. Chest and Lung Exam Chest and lung exam reveals -on auscultation, normal breath sounds, no adventitious sounds and normal vocal resonance. Breast Nipples-No Discharge. Breast Lump-No Palpable Breast Mass. Cardiovascular Cardiovascular examination reveals -normal heart sounds, regular rate and rhythm with no murmurs. Lymphatic Head & Neck General Head & Neck Lymphatics: Bilateral - Description - Normal. Axillary General Axillary Region: Bilateral - Description - Normal. Note: no [place] adenopathy   Assessment & Plan Rolm Bookbinder MD; 05/01/2016 9:52 PM) BREAST CANCER OF UPPER-OUTER QUADRANT OF RIGHT FEMALE BREAST (C50.411) Story: Right breast seed guided lumpectomy, right axillary sentinel node biopsy We discussed the staging and pathophysiology of breast cancer. We discussed all of the different options for treatment for breast cancer including surgery, chemotherapy, radiation therapy, Herceptin, and antiestrogen therapy. We discussed a sentinel lymph node biopsy as she does not appear to having lymph node involvement right now. We discussed the performance of that with injection of radioactive tracer. We discussed that there is a chance of having a positive node with a sentinel lymph node biopsy and we will await the permanent pathology to make any other first further  decisions in terms of her treatment.We discussed up to a 5% risk lifetime of chronic shoulder pain as well as lymphedema associated with a sentinel lymph node biopsy. We discussed the options for treatment of the breast cancer which included lumpectomy versus a mastectomy. We discussed the performance of the lumpectomy with radioactive seed placement. We discussed a 5-10% chance of a positive margin requiring reexcision in the operating room. We also discussed that she will need radiation therapy if she undergoes lumpectomy. We discussed the mastectomy (removal of whole breast) and the postoperative care for that as well. Mastectomy can be followed by reconstruction. The decision for lumpectomy vs mastectomy has no impact on decision for chemotherapy. Most mastectomy patients will not need radiation therapy. We discussed that there is no difference in her survival whether she undergoes lumpectomy with radiation therapy or antiestrogen therapy versus a mastectomy. There is also no real difference between her recurrence in the breast. We discussed the risks of operation including bleeding, infection, possible reoperation. She understands her further therapy will be based on what her stages at the time of her operation.

## 2016-05-15 NOTE — Op Note (Signed)
Preoperative diagnosis: right breast cancer, clinical stage 2 Postoperative diagnosis: same as above Procedure: 1. Right breast seed guided lumpectomy 2. Right axillary sn biopsy Surgeon Dr Serita Grammes Anes general with pectoral block EBL: minimal Comps none Specimen:  1. Right breast marked with paint 2. Right axillary nodes with highest count of 3092 3. Additional posterior margin marked short superior, long lateral, double deep Sponge count correct at completion dispo to recovery stable  Indications: This is a 33 yof who has newly diagnosed right breast cancer.  We discussed proceeding with lumpectomy/sn biopsy. She had radioactive seed placed prior to beginning. I had these mm in the OR.   Procedure: After informed consent was obtained the patient was taken to the OR. She was injected with technetium in the standard periareolar fashion. She underwent a pectoral block. She was given ancef. SCDs were in place. She was prepped and draped in the standard sterile surgical fashion. A timeout was performed. I then made a periareolar incision in the right breast.I then removed the seed with an attempt to get a clear margin.  I did confirm removal of the clip and seed with mammography. the posterior margin was thought to be close by pathology and I did remove additional margin.  I placed clips in the cavity. I then closed this with 2-0 vicryl, 3-0 vicryl and 5-0 monocryl. I placed glue and steristrips on this wound. I then made an axillary incision I went through the axillary fascia. I identified the sentinel nodes with the highest count as above. There was no background radioactivity. I obtained hemostasis. I then closed the fascia with 2-0 vicryl. I placed arista in the entire cavity. The skin was closed with 3-0 vicryl and 4-0 monocryl. Glue and steristrips were placed A breast binder was placed. She was extubated and transferred to recovery stable

## 2016-05-15 NOTE — Transfer of Care (Signed)
Immediate Anesthesia Transfer of Care Note  Patient: Rachael Villa  Procedure(s) Performed: Procedure(s): RADIOACTIVE SEED GUIDED RIGHT BREAST LUMPECTOMY WITH AXILLARY SENTINEL LYMPH NODE BIOPSY (Right)  Patient Location: PACU  Anesthesia Type:GA combined with regional for post-op pain  Level of Consciousness: awake, alert  and oriented  Airway & Oxygen Therapy: Patient Spontanous Breathing and Patient connected to face mask oxygen  Post-op Assessment: Report given to RN and Post -op Vital signs reviewed and stable  Post vital signs: Reviewed and stable  Last Vitals:  Filed Vitals:   05/15/16 1400 05/15/16 1415  BP: 128/68 120/68  Pulse: 67 83  Temp:    Resp: 16 19    Last Pain: There were no vitals filed for this visit.       Complications: No apparent anesthesia complications

## 2016-05-15 NOTE — Anesthesia Postprocedure Evaluation (Signed)
Anesthesia Post Note  Patient: Corporate treasurer  Procedure(s) Performed: Procedure(s) (LRB): RADIOACTIVE SEED GUIDED RIGHT BREAST LUMPECTOMY WITH AXILLARY SENTINEL LYMPH NODE BIOPSY (Right)  Patient location during evaluation: PACU Anesthesia Type: General Level of consciousness: sedated Pain management: pain level controlled Vital Signs Assessment: post-procedure vital signs reviewed and stable Respiratory status: spontaneous breathing and respiratory function stable Cardiovascular status: stable Anesthetic complications: no    Last Vitals:  Filed Vitals:   05/15/16 1645 05/15/16 1700  BP: 145/81 126/82  Pulse: 77 75  Temp:    Resp: 16 14    Last Pain:  Filed Vitals:   05/15/16 1705  PainSc: 4                  Tinnie Kunin DANIEL

## 2016-05-15 NOTE — Discharge Instructions (Signed)
[facility] °Office Phone Number [phone] ° °POST OP INSTRUCTIONS ° °Always review your discharge instruction sheet given to you by the facility where your surgery was performed. ° °IF YOU HAVE DISABILITY OR FAMILY LEAVE FORMS, YOU MUST BRING THEM TO THE OFFICE FOR PROCESSING.  DO NOT GIVE THEM TO YOUR DOCTOR. ° °1. A prescription for pain medication may be given to you upon discharge.  Take your pain medication as prescribed, if needed.  If narcotic pain medicine is not needed, then you may take acetaminophen (Tylenol), naprosyn (Alleve) or ibuprofen (Advil) as needed. °2. Take your usually prescribed medications unless otherwise directed °3. If you need a refill on your pain medication, please contact your pharmacy.  They will contact our office to request authorization.  Prescriptions will not be filled after 5pm or on week-ends. °4. You should eat very light the first 24 hours after surgery, such as soup, crackers, pudding, etc.  Resume your normal diet the day after surgery. °5. Most patients will experience some swelling and bruising in the breast.  Ice packs and a good support bra will help.  Wear the breast binder provided or a sports bra for 72 hours day and night.  After that wear a sports bra during the day until you return to the office. Swelling and bruising can take several days to resolve.  °6. It is common to experience some constipation if taking pain medication after surgery.  Increasing fluid intake and taking a stool softener will usually help or prevent this problem from occurring.  A mild laxative (Milk of Magnesia or Miralax) should be taken according to package directions if there are no bowel movements after 48 hours. °7. Unless discharge instructions indicate otherwise, you may remove your bandages 48 hours after surgery and you may shower at that time.  You may have steri-strips (small skin tapes) in place directly over the incision.  These strips should be left on the  skin for 7-10 days and will come off on their own.  If your surgeon used skin glue on the incision, you may shower in 24 hours.  The glue will flake off over the next 2-3 weeks.  Any sutures or staples will be removed at the office during your follow-up visit. °8. ACTIVITIES:  You may resume regular daily activities (gradually increasing) beginning the next day.  Wearing a good support bra or sports bra minimizes pain and swelling.  You may have sexual intercourse when it is comfortable. °a. You may drive when you no longer are taking prescription pain medication, you can comfortably wear a seatbelt, and you can safely maneuver your car and apply brakes. °b. RETURN TO WORK:  ______________________________________________________________________________________ °9. You should see your doctor in the office for a follow-up appointment approximately two weeks after your surgery.  Your doctor’s nurse will typically make your follow-up appointment when she calls you with your pathology report.  Expect your pathology report 3-4 business days after your surgery.  You may call to check if you do not hear from us after three days. °10. OTHER INSTRUCTIONS: _______________________________________________________________________________________________ _____________________________________________________________________________________________________________________________________ °_____________________________________________________________________________________________________________________________________ °_____________________________________________________________________________________________________________________________________ ° °WHEN TO CALL DR WAKEFIELD: °1. Fever over 101.0 °2. Nausea and/or vomiting. °3. Extreme swelling or bruising. °4. Continued bleeding from incision. °5. Increased pain, redness, or drainage from the incision. ° °The clinic staff is available to answer your questions during regular  business hours.  Please don’t hesitate to call and ask to speak to one of the nurses for clinical concerns.  If   you have a medical emergency, go to the nearest emergency room or call 911.  A surgeon from [facility] is always on call at the hospital. ° °For further questions, please visit [email or web address] ° ° ° °Post Anesthesia Home Care Instructions ° °Activity: °Get plenty of rest for the remainder of the day. A responsible adult should stay with you for 24 hours following the procedure.  °For the next 24 hours, DO NOT: °-Drive a car °-Operate machinery °-Drink alcoholic beverages °-Take any medication unless instructed by your physician °-Make any legal decisions or sign important papers. ° °Meals: °Start with liquid foods such as gelatin or soup. Progress to regular foods as tolerated. Avoid greasy, spicy, heavy foods. If nausea and/or vomiting occur, drink only clear liquids until the nausea and/or vomiting subsides. Call your physician if vomiting continues. ° °Special Instructions/Symptoms: °Your throat may feel dry or sore from the anesthesia or the breathing tube placed in your throat during surgery. If this causes discomfort, gargle with warm salt water. The discomfort should disappear within 24 hours. ° °If you had a scopolamine patch placed behind your ear for the management of post- operative nausea and/or vomiting: ° °1. The medication in the patch is effective for 72 hours, after which it should be removed.  Wrap patch in a tissue and discard in the trash. Wash hands thoroughly with soap and water. °2. You may remove the patch earlier than 72 hours if you experience unpleasant side effects which may include dry mouth, dizziness or visual disturbances. °3. Avoid touching the patch. Wash your hands with soap and water after contact with the patch. °  ° °

## 2016-05-17 ENCOUNTER — Other Ambulatory Visit (HOSPITAL_COMMUNITY): Payer: Self-pay | Admitting: *Deleted

## 2016-05-21 ENCOUNTER — Encounter (HOSPITAL_BASED_OUTPATIENT_CLINIC_OR_DEPARTMENT_OTHER): Payer: Self-pay | Admitting: General Surgery

## 2016-05-31 ENCOUNTER — Other Ambulatory Visit (HOSPITAL_COMMUNITY): Payer: Self-pay | Admitting: Emergency Medicine

## 2016-05-31 NOTE — Progress Notes (Signed)
Spoke with Rachael Villa in pathology.  No onco-type has resulted.  It was sent on 05/20/2016.  She is going to call and see if it has resulted and if it has she will fax it to [place].

## 2016-06-05 NOTE — Progress Notes (Signed)
[facility]  Progress Note  Patient Care Team: No Pcp Per Patient as PCP - General (General Practice)  CHIEF COMPLAINTS:  Right breast cancer, ER+ PR+ HER-2(-)     Breast cancer of upper-outer quadrant of right female breast ([place])   03/28/2016 Mammogram    1.9 cm irregular spiculated mass in the upper inner quadrant of right breast.     04/19/2016 Pathology Results    [facility]-infiltrating ductal carcinoma of right breast.     05/03/2016 Pathology Results    [facility] pathology consult- Consult [facility] , right breast biopsy - INVASIVE DUCTAL CARCINOMA. ER (95%, strong staining intensity) and PR (95%, strong staining intensity). Her2 is negative (1+). Ki-67 reveals a proliferation rate of 30% and p53 is negative.     05/09/2016 Procedure    Right axillary lymph node biopsy     05/09/2016 Pathology Results    Lymph node, needle/core biopsy, right axillary LYMPHOID TISSUE, NEGATIVE FOR CARCINOMA.     05/15/2016 Surgery    R breast seed guided lumpectomy, R axillary SN biopsy     05/15/2016 Pathology Results    invasive ductal carcinoma 2 cm, sentinel node micrometastatic carcinoma 0.2 mm, total 3 sentinel nodes 1/3 with micromets, ER 95%, PR 95%, Her 2 neu - Ki-67 30% pT1c, pN1MipNX      HISTORY OF PRESENTING ILLNESS:  Rachael Villa 50 y.o. female is here because of Right breast cancer, ER+ PR+ HER-2(-).  I personally reviewed and went over pathology results with the patient.   Rachael Villa is doing well. Oncotype is not back. It was supposed to be back today. Rachael Villa has met with XRT in [place] and has a follow-up on Tuesday. I advised her we should have results prior to her visit there.  Rachael Villa notes numbness under the R axillae. No other complaints. A little emotional at times but overall notes Rachael Villa is doing well. Rachael Villa is back to work full time.   MEDICAL HISTORY:  Past Medical History:  Diagnosis Date  . Breast cancer of upper-outer quadrant of right female breast ([place])  05/03/2016  . GERD (gastroesophageal reflux disease)    less since gallbladder removed  . Headache    while on BCP  . Hypertension     SURGICAL HISTORY: Past Surgical History:  Procedure Laterality Date  . CHOLECYSTECTOMY  2011  . HERNIA REPAIR  2017   UHR  . RADIOACTIVE SEED GUIDED MASTECTOMY WITH AXILLARY SENTINEL LYMPH NODE BIOPSY Right 05/15/2016   Procedure: RADIOACTIVE SEED GUIDED RIGHT BREAST LUMPECTOMY WITH AXILLARY SENTINEL LYMPH NODE BIOPSY;  Surgeon: Rolm Bookbinder, MD;  Location: [facility];  Service: General;  Laterality: Right;  . TUBAL LIGATION    . WISDOM TOOTH EXTRACTION      SOCIAL HISTORY: Social History   Social History  . Marital status: Married    Spouse name: N/A  . Number of children: N/A  . Years of education: N/A   Occupational History  . Not on file.   Social History Main Topics  . Smoking status: Former Research scientist (life sciences)  . Smokeless tobacco: Never Used  . Alcohol use Yes     Comment: social  . Drug use: No  . Sexual activity: Yes    Birth control/ protection: Surgical     Comment: TL   Other Topics Concern  . Not on file   Social History Narrative  . 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 Diabetes Mother   . Hypertension Mother   . Cirrhosis Mother     NAFLD  . Hypertension Father   . Cancer Father     colon    Mother is still living and mostly healthy. Father deceased at 34 yo of colon cancer and related issues.  1 sibling No other history of breast cancer in the family that Rachael Villa is aware of.   ALLERGIES:  is allergic to shrimp [shellfish allergy].  MEDICATIONS:  Current Outpatient Prescriptions  Medication Sig Dispense  Refill  . ALPRAZolam (XANAX) 0.25 MG tablet Take 0.25 mg by mouth at bedtime as needed for anxiety.    . cetirizine (ZYRTEC) 10 MG tablet Take 10 mg by mouth daily.    Marland Kitchen losartan (COZAAR) 50 MG tablet Take 50 mg by mouth daily.    . Probiotic Product (PROBIOTIC ADVANCED PO) Take by mouth.    Marland Kitchen HYDROcodone-acetaminophen (NORCO) 10-325 MG tablet Take 1 tablet by mouth every 6 (six) hours as needed. (Patient not taking: Reported on 06/06/2016) 20 tablet 0   No current facility-administered medications for this visit.     Review of Systems  Constitutional: Negative.   HENT: Negative.   Eyes: Negative.   Respiratory: Negative.   Cardiovascular: Negative.   Gastrointestinal: Abdominal pain: secondary to hernia surgery.  Genitourinary: Negative.   Musculoskeletal: Negative.   Skin: Negative.   Neurological: Negative.   Endo/Heme/Allergies: Negative.   Psychiatric/Behavioral: Negative.   All other systems reviewed and are negative. 14 point ROS was done and is otherwise as detailed above or in HPI   PHYSICAL EXAMINATION: ECOG PERFORMANCE STATUS: 0 - Asymptomatic  Vitals:   06/06/16 0800  BP: 136/74  Pulse: 79  Resp: 16  Temp: 98.5 F (36.9 C)   Filed Weights   06/06/16 0800  Weight: 199 lb 9.6 oz (90.5 kg)     Physical Exam  Constitutional: Rachael Villa is oriented to person, place, and time and well-developed, well-nourished, and in no distress.  HENT:  Head: Normocephalic and atraumatic.  Nose: Nose normal.  Mouth/Throat: Oropharynx is clear and moist. No oropharyngeal exudate.  Eyes: Conjunctivae and EOM are normal. Pupils are equal, round, and reactive to light. Right eye exhibits no discharge. Left eye exhibits no discharge. No scleral icterus.  Neck: Normal range of motion. Neck supple. No tracheal deviation present. No thyromegaly present.  Cardiovascular: Normal rate, regular rhythm and normal heart sounds.  Exam reveals no gallop and no friction rub.   No murmur  heard. Pulmonary/Chest: Effort normal and breath sounds normal. Rachael Villa has no wheezes. Rachael Villa has no rales.    Abdominal: Soft. Bowel sounds are normal. Rachael Villa exhibits no distension and no mass. There is no tenderness. There is no rebound and no guarding.  Musculoskeletal: Normal range of motion. Rachael Villa exhibits no edema.  Lymphadenopathy:    Rachael Villa has no cervical adenopathy.  Neurological: Rachael Villa is alert and oriented to person, place, and time. Rachael Villa has normal reflexes. No cranial nerve deficit. Gait normal. Coordination normal.  Skin: Skin is warm and dry. No rash noted.  Psychiatric: Mood, memory, affect and judgment normal.  Nursing note and vitals reviewed.   LABORATORY DATA:  I have reviewed the data as listed No results  found for: WBC, HGB, HCT, MCV, PLT CMP  No results found for: NA, K, CL, CO2, GLUCOSE, BUN, CREATININE, CALCIUM, PROT, ALBUMIN, AST, ALT, ALKPHOS, BILITOT, GFRNONAA, GFRAA   RADIOGRAPHIC STUDIES: I have personally reviewed the radiological images as listed and agreed with the findings in the report. No results found.  PATHOLOGY   ASSESSMENT & PLAN:  Right breast cancer upper inner quadrant, ER+ PR+ HER-2(-)  Stage IB [id number]  We reviewed her pathology in detail. We spent time today discussing endocrine therapy. Rachael Villa is post menopausal. We did discuss some of the emotional aspects of breast cancer as Rachael Villa noted that today was the first time Rachael Villa was tearful over her diagnosis.   ONCOTYPE was supposed to be available today, I advised the patient that I will stay on top of the results and once available will call her to discuss. Rachael Villa has a radiation oncology [place] Tuesday.   Will plan on seeing her back in 7 to 8 weeks at which time will do additional teaching regarding endocrine therapy, order a DEXA and move forward.  All questions were answered. The patient knows to call the clinic with any problems, questions or concerns.  This note was  electronically signed.    PMolli Hazard MD  06/06/2016 6:20 PM

## 2016-06-06 ENCOUNTER — Encounter (HOSPITAL_COMMUNITY): Payer: Self-pay | Admitting: Hematology & Oncology

## 2016-06-06 ENCOUNTER — Encounter (HOSPITAL_BASED_OUTPATIENT_CLINIC_OR_DEPARTMENT_OTHER): Payer: BLUE CROSS/BLUE SHIELD | Admitting: Hematology & Oncology

## 2016-06-06 VITALS — BP 136/74 | HR 79 | Temp 98.5°F | Resp 16 | Ht 66.0 in | Wt 199.6 lb

## 2016-06-06 DIAGNOSIS — C50211 Malignant neoplasm of upper-inner quadrant of right female breast: Secondary | ICD-10-CM | POA: Diagnosis not present

## 2016-06-06 DIAGNOSIS — C50911 Malignant neoplasm of unspecified site of right female breast: Secondary | ICD-10-CM

## 2016-06-06 DIAGNOSIS — Z170 Estrogen receptor positive status [ER+]: Secondary | ICD-10-CM | POA: Diagnosis not present

## 2016-06-06 NOTE — Patient Instructions (Signed)
[place] at [facility] Discharge Instructions  RECOMMENDATIONS MADE BY THE CONSULTANT AND ANY TEST RESULTS WILL BE SENT TO YOUR REFERRING PHYSICIAN.  You saw Dr. Whitney Muse today. Return for follow up in 7 weeks. We will call you with oncotype results.  Thank you for choosing [place] at [facility] to provide your oncology and hematology care.  To afford each patient quality time with our provider, please arrive at least 15 minutes before your scheduled appointment time.   Beginning January 23rd 2017 lab work for the [facility] will be done in the  Main lab at [facility] on 1st floor. If you have a lab appointment with the [facility] please come in thru the  Main Entrance and check in at the main information desk  You need to re-schedule your appointment should you arrive 10 or more minutes late.  We strive to give you quality time with our providers, and arriving late affects you and other patients whose appointments are after yours.  Also, if you no show three or more times for appointments you may be dismissed from the clinic at the providers discretion.     Again, thank you for choosing [facility].  Our hope is that these requests will decrease the amount of time that you wait before being seen by our physicians.       _____________________________________________________________  Should you have questions after your visit to [facility], please contact our office at [phone] between the hours of 8:30 a.m. and 4:30 p.m.  Voicemails left after 4:30 p.m. will not be returned until the following business day.  For prescription refill requests, have your pharmacy contact our office.         Resources For Cancer Patients and their Caregivers ? American Cancer Society: Can assist with transportation, wigs, general needs, runs Look Good Feel Better.        470-441-5422 ? Cancer Care: Provides  financial assistance, online support groups, medication/co-pay assistance.  1-800-813-HOPE 989-877-1593) ? [place] Assists [place] cancer patients and their families through emotional , educational and financial support.  6284472591 ? [place] DSS Where to apply for food stamps, Medicaid and utility assistance. 769-882-9313 ? [facility]: Transportation to medical appointments. 743-381-8846 ? Social Security Administration: May apply for disability if have a Stage IV cancer. 610-294-0596 (731)289-6962 ? [employer], Disability and Transit Services: Assists with nutrition, care and transit needs. [place] Support Programs: @10RELATIVEDAYS @ > Cancer Support Group  2nd Tuesday of the month 1pm-2pm, Journey Room  > Creative Journey  3rd Tuesday of the month 1130am-1pm, Journey Room  > Look Good Feel Better  1st Wednesday of the month 10am-12 noon, Journey Room (Call [place] to register [phone])

## 2016-06-08 ENCOUNTER — Encounter (HOSPITAL_COMMUNITY): Payer: Self-pay | Admitting: Hematology & Oncology

## 2016-06-10 ENCOUNTER — Other Ambulatory Visit (HOSPITAL_COMMUNITY): Payer: Self-pay | Admitting: Emergency Medicine

## 2016-06-10 NOTE — Progress Notes (Unsigned)
Pt called and wanted to know about onco-type.  Called pt back to let her know that the onco-type was still not back yet.  Apologized.  I called again today.  They do have all the correct information.  They are just waiting to here back from the insurance company to release the onco-type.  Dr Whitney Muse would like pt to wait to start radiation til we have the results of the pnco-type back.  Pt is not to start radiation til August 7.

## 2016-06-11 ENCOUNTER — Encounter (HOSPITAL_COMMUNITY): Payer: Self-pay | Admitting: Emergency Medicine

## 2016-06-11 ENCOUNTER — Encounter (HOSPITAL_COMMUNITY): Payer: Self-pay

## 2016-06-11 NOTE — Progress Notes (Unsigned)
Called pt to let her know of her onco-type was low and she would not need chemotherapy.  Pt can precede with radiation as planned.

## 2016-07-25 ENCOUNTER — Ambulatory Visit (HOSPITAL_COMMUNITY): Payer: BLUE CROSS/BLUE SHIELD | Admitting: Hematology & Oncology

## 2016-08-14 ENCOUNTER — Encounter (HOSPITAL_COMMUNITY): Payer: Self-pay | Admitting: Hematology & Oncology

## 2016-08-14 ENCOUNTER — Encounter (HOSPITAL_COMMUNITY): Payer: BLUE CROSS/BLUE SHIELD | Attending: Hematology & Oncology | Admitting: Hematology & Oncology

## 2016-08-14 VITALS — BP 148/82 | HR 87 | Temp 99.1°F | Resp 16 | Wt 207.8 lb

## 2016-08-14 DIAGNOSIS — C50211 Malignant neoplasm of upper-inner quadrant of right female breast: Secondary | ICD-10-CM | POA: Diagnosis not present

## 2016-08-14 DIAGNOSIS — Z170 Estrogen receptor positive status [ER+]: Secondary | ICD-10-CM

## 2016-08-14 DIAGNOSIS — Z5181 Encounter for therapeutic drug level monitoring: Secondary | ICD-10-CM | POA: Insufficient documentation

## 2016-08-14 DIAGNOSIS — Z79810 Long term (current) use of selective estrogen receptor modulators (SERMs): Secondary | ICD-10-CM | POA: Insufficient documentation

## 2016-08-14 DIAGNOSIS — N959 Unspecified menopausal and perimenopausal disorder: Secondary | ICD-10-CM | POA: Insufficient documentation

## 2016-08-14 MED ORDER — TAMOXIFEN CITRATE 20 MG PO TABS: 20.0000 mg | ORAL_TABLET | Freq: Every day | ORAL | 3 refills | Status: DC

## 2016-08-14 NOTE — Progress Notes (Signed)
[name]  Progress Note  Patient Care Team: No Pcp Per Patient as PCP - General (General Practice)  CHIEF COMPLAINTS:  Right breast cancer, ER+ PR+ HER-2(-)     Breast cancer of upper-inner quadrant of right female breast ([place])   03/28/2016 Mammogram    1.9 cm irregular spiculated mass in the upper inner quadrant of right breast.      04/19/2016 Pathology Results    [place] pathology-infiltrating ductal carcinoma of right breast.      05/03/2016 Pathology Results    [place] pathology consult- Consult Slide , right breast biopsy - INVASIVE DUCTAL CARCINOMA. ER (95%, strong staining intensity) and PR (95%, strong staining intensity). Her2 is negative (1+). Ki-67 reveals a proliferation rate of 30% and p53 is negative.      05/09/2016 Procedure    Right axillary lymph node biopsy      05/09/2016 Pathology Results    Lymph node, needle/core biopsy, right axillary LYMPHOID TISSUE, NEGATIVE FOR CARCINOMA.      05/15/2016 Surgery    R breast seed guided lumpectomy, R axillary SN biopsy      05/15/2016 Pathology Results    invasive ductal carcinoma 2 cm, sentinel node micrometastatic carcinoma 0.2 mm, total 3 sentinel nodes 1/3 with micromets, ER 95%, PR 95%, Her 2 neu - Ki-67 30% pT1c, pN1MipNX       Radiation Therapy     The patient saw No care team member to display for radiation treatment. This is the current list of radiation treatment: Radiation Treatments       Patient's record has no active or historical radiation treatments documented.             05/21/2016 Oncotype testing    Recurrence score results of 14, 10 year risk of distant recurrence with tamoxifen alone 9%       HISTORY OF PRESENTING ILLNESS:  Rachael Villa 50 y.o. female is here for additional follow-up of Right breast cancer, ER+ PR+ HER-2(-), stage IB with micrometastatic disease to a sentinel LN. She has completed XRT.  Rachael Villa is unaccompanied.   She is doing well, "I really do  feel good". Her mood is much better. Her family and friends have been very supportive.   She has completed radiation treatment. She notes her skin is itchy and peeling a little in the right inframammary fold. She still reports some soreness in the right axillary region.  She has not had one hot flash since starting radiation.  She has not had a bone density scan. She is pre menopausal and still has periods.  Appetite is good. Energy is returning.   She is curious if she can return to the gym. She notes that she feels ready to get back into physical activity.  She presents for ongoing discussion regarding endocrine therapy.    MEDICAL HISTORY:  Past Medical History:  Diagnosis Date  . Breast cancer of upper-outer quadrant of right female breast ([place]) 05/03/2016  . GERD (gastroesophageal reflux disease)    less since gallbladder removed  . Headache    while on BCP  . Hypertension     SURGICAL HISTORY: Past Surgical History:  Procedure Laterality Date  . CHOLECYSTECTOMY  2011  . HERNIA REPAIR  2017   UHR  . RADIOACTIVE SEED GUIDED MASTECTOMY WITH AXILLARY SENTINEL LYMPH NODE BIOPSY Right 05/15/2016   Procedure: RADIOACTIVE SEED GUIDED RIGHT BREAST LUMPECTOMY WITH AXILLARY SENTINEL LYMPH NODE BIOPSY;  Surgeon: Rolm Bookbinder, MD;  Location: [facility];  Service: General;  Laterality: Right;  . TUBAL LIGATION    . WISDOM TOOTH EXTRACTION      SOCIAL HISTORY: Social History   Social History  . Marital status: Married    Spouse name: N/A  . Number of children: N/A  . Years of education: N/A   Occupational History  . Not on file.   Social History Main Topics  . Smoking status: Former Research scientist (life sciences)  . Smokeless tobacco: Never Used  . Alcohol use Yes     Comment: social  . Drug use: No  . Sexual activity: Yes    Birth control/ protection: Surgical     Comment: TL   Other Topics Concern  . Not on file   Social History Narrative  . 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 Diabetes Mother   . Hypertension Mother   . Cirrhosis Mother     NAFLD  . Hypertension Father   . Cancer Father     colon    Mother is still living and mostly healthy. Father deceased at 59 yo of colon cancer and related issues.  1 sibling No other history of breast cancer in the family that she is aware of.   ALLERGIES:  is allergic to shrimp [shellfish allergy].  MEDICATIONS:  Current Outpatient Prescriptions  Medication Sig Dispense Refill  . ALPRAZolam (XANAX) 0.25 MG tablet Take 0.25 mg by mouth at bedtime as needed for anxiety.    . cetirizine (ZYRTEC) 10 MG tablet Take 10 mg by mouth daily.    Marland Kitchen HYDROcodone-acetaminophen (NORCO) 10-325 MG tablet Take 1 tablet by mouth every 6 (six) hours as needed. 20 tablet 0  . losartan (COZAAR) 50 MG tablet Take 50 mg by mouth daily.    . Probiotic Product (PROBIOTIC ADVANCED PO) Take by mouth.    . tamoxifen (NOLVADEX) 20 MG tablet Take 1 tablet (20 mg total) by mouth daily. 30 tablet 3   No current facility-administered medications for this visit.     Review of Systems  Constitutional: Negative.   HENT: Negative.   Eyes: Negative.   Respiratory: Negative.   Cardiovascular: Negative.   Gastrointestinal: Abdominal pain: secondary to hernia surgery.  Genitourinary: Negative.   Musculoskeletal: Negative.   Skin: Positive for itching.       Skin is itchy and peeling a little in the right inframammary fold. Some soreness in the right axillary region.  Neurological: Negative.   Endo/Heme/Allergies: Negative.   Psychiatric/Behavioral: Negative.   All other systems reviewed and are negative. 14 point ROS was done and is  otherwise as detailed above or in HPI   PHYSICAL EXAMINATION: ECOG PERFORMANCE STATUS: 0 - Asymptomatic  Vitals:   08/14/16 1359  BP: (!) 148/82  Pulse: 87  Resp: 16  Temp: 99.1 F (37.3 C)   Filed Weights   08/14/16 1359  Weight: 207 lb 12.8 oz (94.3 kg)     Physical Exam  Constitutional: She is oriented to person, place, and time and well-developed, well-nourished, and in no distress.  HENT:  Head: Normocephalic and atraumatic.  Nose: Nose normal.  Mouth/Throat: Oropharynx is clear and moist. No oropharyngeal exudate.  Eyes: Conjunctivae and EOM are normal. Pupils are equal, round, and reactive to light. Right eye exhibits no discharge. Left eye exhibits no discharge. No scleral icterus.  Neck: Normal range of motion. Neck supple. No tracheal deviation present. No thyromegaly present.  Cardiovascular: Normal rate, regular rhythm and normal heart sounds.  Exam reveals no gallop and no friction rub.   No murmur heard. Pulmonary/Chest: Effort normal and breath sounds normal. She has no wheezes. She has no rales.    Abdominal: Soft. Bowel sounds are normal. She exhibits no distension and no mass. There is no tenderness. There is no rebound and no guarding.  Musculoskeletal: Normal range of motion. She exhibits no edema.  Lymphadenopathy:    She has no cervical adenopathy.  Neurological: She is alert and oriented to person, place, and time. She has normal reflexes. No cranial nerve deficit. Gait normal. Coordination normal.  Skin: Skin is warm and dry. No rash noted.  Psychiatric: Mood, memory, affect and judgment normal.  Nursing note and vitals reviewed.   LABORATORY DATA:  I have reviewed the data as listed No results found for: WBC, HGB, HCT, MCV, PLT CMP  No results found for: NA, K, CL, CO2, GLUCOSE, BUN, CREATININE, CALCIUM, PROT, ALBUMIN, AST, ALT, ALKPHOS, BILITOT, GFRNONAA, GFRAA   RADIOGRAPHIC STUDIES: I have personally reviewed the radiological images as  listed and agreed with the findings in the report. No results found.  PATHOLOGY   ASSESSMENT & PLAN:  Right breast cancer upper inner quadrant, ER+ PR+ HER-2(-)  Stage IB [id number] Premenopausal   We reviewed her pathology in detail. We spent time today discussing endocrine therapy. She is pre-menopausal. We discussed the risks and benefits of tamoxifen. These include but not limited to insomnia, hot flashes, mood changes, vaginal dryness. Although rare, serious side effects including endometrial cancer, risk of blood clots were also discussed. We strongly believe that the benefits far outweigh the risks. Patient understands these risks and consented to starting treatment. Planned treatment duration is 5 - 10 years. She is to continue with yearly gynecologic visits while on Tamoxifen, this was emphasized to her today.   She has completed radiation therapy. She has good skin care. She is currently using silvadene and Radiaplex.   Encouraged her to begin taking calcium and Vitamin D supplements for bone health.   I have written her a prescription for Tamoxifen. She will contact our office if she experiences any problems on this medication prior to scheduled follow-up in 6 weeks.   She will return for follow up in 6 to 7 weeks. If she is tolerating Tamoxifen well at that time, we will space our visits out to 3 months.   All questions were answered. The patient knows to call the clinic with any problems, questions or concerns.  This document serves as a record of services personally performed by SAncil Linsey MD. It was created on her behalf by EArlyce Harman a trained medical scribe. The creation of this record is based on the scribe's personal observations and the provider's statements to them. This document has been checked and approved by the attending provider.  I have reviewed the above documentation for accuracy and completeness and I agree with the above.  This note was  electronically signed.    PMolli Hazard MD  08/15/2016 9:18 PM

## 2016-08-14 NOTE — Patient Instructions (Addendum)
[place] at [facility] Discharge Instructions  RECOMMENDATIONS MADE BY THE CONSULTANT AND ANY TEST RESULTS WILL BE SENT TO YOUR REFERRING PHYSICIAN.  You saw Dr. Whitney Muse today. Start taking Tamoxifen daily-sent to pharmacy. Follow up in 6 weeks with labs- get appt. from Amy.  Thank you for choosing [place] at [facility] to provide your oncology and hematology care.  To afford each patient quality time with our provider, please arrive at least 15 minutes before your scheduled appointment time.   Beginning January 23rd 2017 lab work for the [facility] will be done in the  Main lab at [facility] on 1st floor. If you have a lab appointment with the [facility] please come in thru the  Main Entrance and check in at the main information desk  You need to re-schedule your appointment should you arrive 10 or more minutes late.  We strive to give you quality time with our providers, and arriving late affects you and other patients whose appointments are after yours.  Also, if you no show three or more times for appointments you may be dismissed from the clinic at the providers discretion.     Again, thank you for choosing [facility].  Our hope is that these requests will decrease the amount of time that you wait before being seen by our physicians.       _____________________________________________________________  Should you have questions after your visit to [facility], please contact our office at [phone] between the hours of 8:30 a.m. and 4:30 p.m.  Voicemails left after 4:30 p.m. will not be returned until the following business day.  For prescription refill requests, have your pharmacy contact our office.         Resources For Cancer Patients and their Caregivers ? American Cancer Society: Can assist with transportation, wigs, general needs, runs Look Good Feel Better.         332-071-4508 ? Cancer Care: Provides financial assistance, online support groups, medication/co-pay assistance.  1-800-813-HOPE 425 001 2208) ? [facility] Assists [place] cancer patients and their families through emotional , educational and financial support.  (207) 084-0994 ? [place] DSS Where to apply for food stamps, Medicaid and utility assistance. (432)231-6956 ? RCATS: Transportation to medical appointments. (806)643-3388 ? Social Security Administration: May apply for disability if have a Stage IV cancer. 734-222-9793 215-505-0454 ? [employer], Disability and Transit Services: Assists with nutrition, care and transit needs. [facility] Support Programs: @10RELATIVEDAYS @ > Cancer Support Group  2nd Tuesday of the month 1pm-2pm, Journey Room  > Creative Journey  3rd Tuesday of the month 1130am-1pm, Journey Room  > Look Good Feel Better  1st Wednesday of the month 10am-12 noon, Journey Room (Call [place] to register [phone])

## 2016-08-15 ENCOUNTER — Encounter (HOSPITAL_COMMUNITY): Payer: Self-pay | Admitting: Hematology & Oncology

## 2016-09-30 ENCOUNTER — Other Ambulatory Visit (HOSPITAL_COMMUNITY): Payer: Self-pay | Admitting: *Deleted

## 2016-09-30 DIAGNOSIS — C50211 Malignant neoplasm of upper-inner quadrant of right female breast: Secondary | ICD-10-CM

## 2016-09-30 DIAGNOSIS — Z170 Estrogen receptor positive status [ER+]: Principal | ICD-10-CM

## 2016-10-01 ENCOUNTER — Encounter (HOSPITAL_COMMUNITY): Payer: BLUE CROSS/BLUE SHIELD

## 2016-10-01 ENCOUNTER — Encounter (HOSPITAL_COMMUNITY): Payer: BLUE CROSS/BLUE SHIELD | Attending: Hematology & Oncology | Admitting: Hematology & Oncology

## 2016-10-01 ENCOUNTER — Encounter (HOSPITAL_COMMUNITY): Payer: Self-pay | Admitting: Hematology & Oncology

## 2016-10-01 VITALS — BP 110/51 | HR 85 | Temp 98.2°F | Resp 16 | Wt 208.6 lb

## 2016-10-01 DIAGNOSIS — Z923 Personal history of irradiation: Secondary | ICD-10-CM | POA: Diagnosis not present

## 2016-10-01 DIAGNOSIS — R200 Anesthesia of skin: Secondary | ICD-10-CM | POA: Insufficient documentation

## 2016-10-01 DIAGNOSIS — Z170 Estrogen receptor positive status [ER+]: Principal | ICD-10-CM

## 2016-10-01 DIAGNOSIS — I10 Essential (primary) hypertension: Secondary | ICD-10-CM | POA: Insufficient documentation

## 2016-10-01 DIAGNOSIS — K219 Gastro-esophageal reflux disease without esophagitis: Secondary | ICD-10-CM | POA: Diagnosis not present

## 2016-10-01 DIAGNOSIS — C50211 Malignant neoplasm of upper-inner quadrant of right female breast: Secondary | ICD-10-CM

## 2016-10-01 DIAGNOSIS — Z79899 Other long term (current) drug therapy: Secondary | ICD-10-CM | POA: Insufficient documentation

## 2016-10-01 DIAGNOSIS — Z9889 Other specified postprocedural states: Secondary | ICD-10-CM | POA: Insufficient documentation

## 2016-10-01 DIAGNOSIS — Z833 Family history of diabetes mellitus: Secondary | ICD-10-CM | POA: Diagnosis not present

## 2016-10-01 DIAGNOSIS — Z5181 Encounter for therapeutic drug level monitoring: Secondary | ICD-10-CM

## 2016-10-01 DIAGNOSIS — Z800 Family history of malignant neoplasm of digestive organs: Secondary | ICD-10-CM | POA: Diagnosis not present

## 2016-10-01 DIAGNOSIS — Z79810 Long term (current) use of selective estrogen receptor modulators (SERMs): Secondary | ICD-10-CM | POA: Diagnosis not present

## 2016-10-01 DIAGNOSIS — Z91013 Allergy to seafood: Secondary | ICD-10-CM | POA: Diagnosis not present

## 2016-10-01 DIAGNOSIS — N951 Menopausal and female climacteric states: Secondary | ICD-10-CM | POA: Insufficient documentation

## 2016-10-01 DIAGNOSIS — Z87891 Personal history of nicotine dependence: Secondary | ICD-10-CM | POA: Insufficient documentation

## 2016-10-01 DIAGNOSIS — C50911 Malignant neoplasm of unspecified site of right female breast: Secondary | ICD-10-CM

## 2016-10-01 DIAGNOSIS — N959 Unspecified menopausal and perimenopausal disorder: Secondary | ICD-10-CM

## 2016-10-01 DIAGNOSIS — Z9049 Acquired absence of other specified parts of digestive tract: Secondary | ICD-10-CM | POA: Diagnosis not present

## 2016-10-01 LAB — CBC WITH DIFFERENTIAL/PLATELET
BASOS ABS: 0 10*3/uL (ref 0.0–0.1)
BASOS PCT: 1 %
EOS PCT: 5 %
Eosinophils Absolute: 0.3 10*3/uL (ref 0.0–0.7)
HEMATOCRIT: 40.3 % (ref 36.0–46.0)
Hemoglobin: 13.9 g/dL (ref 12.0–15.0)
LYMPHS PCT: 21 %
Lymphs Abs: 1.3 10*3/uL (ref 0.7–4.0)
MCH: 31.8 pg (ref 26.0–34.0)
MCHC: 34.5 g/dL (ref 30.0–36.0)
MCV: 92.2 fL (ref 78.0–100.0)
Monocytes Absolute: 0.4 10*3/uL (ref 0.1–1.0)
Monocytes Relative: 7 %
NEUTROS ABS: 4.2 10*3/uL (ref 1.7–7.7)
Neutrophils Relative %: 68 %
PLATELETS: 228 10*3/uL (ref 150–400)
RBC: 4.37 MIL/uL (ref 3.87–5.11)
RDW: 12.4 % (ref 11.5–15.5)
WBC: 6.2 10*3/uL (ref 4.0–10.5)

## 2016-10-01 LAB — COMPREHENSIVE METABOLIC PANEL
ALBUMIN: 4 g/dL (ref 3.5–5.0)
ALT: 38 U/L (ref 14–54)
AST: 28 U/L (ref 15–41)
Alkaline Phosphatase: 77 U/L (ref 38–126)
Anion gap: 7 (ref 5–15)
BUN: 14 mg/dL (ref 6–20)
CHLORIDE: 99 mmol/L — AB (ref 101–111)
CO2: 31 mmol/L (ref 22–32)
CREATININE: 0.98 mg/dL (ref 0.44–1.00)
Calcium: 9.4 mg/dL (ref 8.9–10.3)
GFR calc Af Amer: >60 mL/min (ref >60)
GLUCOSE: 117 mg/dL — AB (ref 65–99)
POTASSIUM: 3.6 mmol/L (ref 3.5–5.1)
Sodium: 137 mmol/L (ref 135–145)
Total Bilirubin: 0.9 mg/dL (ref 0.3–1.2)
Total Protein: 7.5 g/dL (ref 6.5–8.1)

## 2016-10-01 NOTE — Patient Instructions (Signed)
[place] at [facility] Discharge Instructions  RECOMMENDATIONS MADE BY THE CONSULTANT AND ANY TEST RESULTS WILL BE SENT TO YOUR REFERRING PHYSICIAN.  Exam with Dr. Aleesia Henney Muse today. Return in 3 months as scheduled.    Thank you for choosing [place] at [facility] to provide your oncology and hematology care.  To afford each patient quality time with our provider, please arrive at least 15 minutes before your scheduled appointment time.   Beginning January 23rd 2017 lab work for the [facility] will be done in the  Main lab at [facility] on 1st floor. If you have a lab appointment with the [facility] please come in thru the  Main Entrance and check in at the main information desk  You need to re-schedule your appointment should you arrive 10 or more minutes late.  We strive to give you quality time with our providers, and arriving late affects you and other patients whose appointments are after yours.  Also, if you no show three or more times for appointments you may be dismissed from the clinic at the providers discretion.     Again, thank you for choosing [facility].  Our hope is that these requests will decrease the amount of time that you wait before being seen by our physicians.       _____________________________________________________________  Should you have questions after your visit to [facility], please contact our office at [phone] between the hours of 8:30 a.m. and 4:30 p.m.  Voicemails left after 4:30 p.m. will not be returned until the following business day.  For prescription refill requests, have your pharmacy contact our office.         Resources For Cancer Patients and their Caregivers ? American Cancer Society: Can assist with transportation, wigs, general needs, runs Look Good Feel Better.        515-381-8236 ? Cancer Care: Provides financial assistance, online support  groups, medication/co-pay assistance.  1-800-813-HOPE 903-738-4136) ? [facility] Assists [place] cancer patients and their families through emotional , educational and financial support.  905-568-4189 ? [place] DSS Where to apply for food stamps, Medicaid and utility assistance. 4132977493 ? RCATS: Transportation to medical appointments. (671)697-3030 ? Social Security Administration: May apply for disability if have a Stage IV cancer. (936)723-3444 650-231-9215 ? [employer], Disability and Transit Services: Assists with nutrition, care and transit needs. [place] Support Programs: @10RELATIVEDAYS @ > Cancer Support Group  2nd Tuesday of the month 1pm-2pm, Journey Room  > Creative Journey  3rd Tuesday of the month 1130am-1pm, Journey Room  > Look Good Feel Better  1st Wednesday of the month 10am-12 noon, Journey Room (Call [place] to register [phone])

## 2016-10-01 NOTE — Progress Notes (Signed)
[facility]  Progress Note  Patient Care Team: No Pcp Per Patient as PCP - General (General Practice)  CHIEF COMPLAINTS:  Right breast cancer, ER+ PR+ HER-2(-)     Breast cancer of upper-inner quadrant of right female breast ([place])   03/28/2016 Mammogram    1.9 cm irregular spiculated mass in the upper inner quadrant of right breast.      04/19/2016 Pathology Results    [facility] pathology-infiltrating ductal carcinoma of right breast.      05/03/2016 Pathology Results    [facility] pathology consult- Consult Slide , right breast biopsy - INVASIVE DUCTAL CARCINOMA. ER (95%, strong staining intensity) and PR (95%, strong staining intensity). Her2 is negative (1+). Ki-67 reveals a proliferation rate of 30% and p53 is negative.      05/09/2016 Procedure    Right axillary lymph node biopsy      05/09/2016 Pathology Results    Lymph node, needle/core biopsy, right axillary LYMPHOID TISSUE, NEGATIVE FOR CARCINOMA.      05/15/2016 Surgery    R breast seed guided lumpectomy, R axillary SN biopsy      05/15/2016 Pathology Results    invasive ductal carcinoma 2 cm, sentinel node micrometastatic carcinoma 0.2 mm, total 3 sentinel nodes 1/3 with micromets, ER 95%, PR 95%, Her 2 neu - Ki-67 30% pT1c, pN1MipNX       Radiation Therapy     The patient saw No care team member to display for radiation treatment. This is the current list of radiation treatment: Radiation Treatments       Patient's record has no active or historical radiation treatments documented.             05/21/2016 Oncotype testing    Recurrence score results of 14, 10 year risk of distant recurrence with tamoxifen alone 9%       HISTORY OF PRESENTING ILLNESS:  Rachael Villa 50 y.o. female is here for additional follow-up of Right breast cancer, ER+ PR+ HER-2(-), stage IB with micrometastatic disease to a sentinel LN. She has completed XRT. She has started tamoxifen.   Rachael Villa is unaccompanied. I  personally reviewed and went over laboratory studies with the patient.  During her blood draw today, she began to feel vagal. This happens occasionally when she has her blood drawn.   She reports hot flashes with Tamoxifen. She notes no other complaints. She is taking it daily.   She continues to experience a little numbness in her right axillary region. She has returned to being able to sleep on her right side without issue. If she sits for a long time without moving her right arm it becomes stiff.   She has been experiencing cramping in her lower extremities over the past year and more prominently in the last 6 months. She asks about her potassium level.   She has began taking Vitamin D and calcium gummies. Appetite is good. No nausea.   MEDICAL HISTORY:  Past Medical History:  Diagnosis Date  . Breast cancer of upper-outer quadrant of right female breast ([place]) 05/03/2016  . GERD (gastroesophageal reflux disease)    less since gallbladder removed  . Headache    while on BCP  . Hypertension     SURGICAL HISTORY: Past Surgical History:  Procedure Laterality Date  . CHOLECYSTECTOMY  2011  . HERNIA REPAIR  2017   UHR  . RADIOACTIVE SEED GUIDED MASTECTOMY WITH AXILLARY SENTINEL LYMPH NODE BIOPSY Right 05/15/2016   Procedure: RADIOACTIVE SEED GUIDED RIGHT  BREAST LUMPECTOMY WITH AXILLARY SENTINEL LYMPH NODE BIOPSY;  Surgeon: Rolm Bookbinder, MD;  Location: [facility];  Service: General;  Laterality: Right;  . TUBAL LIGATION    . WISDOM TOOTH EXTRACTION      SOCIAL HISTORY: Social History   Social History  . Marital status: Married    Spouse name: N/A  . Number of children: N/A  . Years of education: N/A   Occupational History  . Not on file.   Social History Main Topics  . Smoking status: Former Research scientist (life sciences)  . Smokeless tobacco: Never Used  . Alcohol use Yes     Comment: social  . Drug use: No  . Sexual activity: Yes    Birth control/ protection: Surgical      Comment: TL   Other Topics Concern  . Not on file   Social History Narrative  . 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 Diabetes Mother   . Hypertension Mother   . Cirrhosis Mother     NAFLD  . Hypertension Father   . Cancer Father     colon    Mother is still living and mostly healthy. Father deceased at 67 yo of colon cancer and related issues.  1 sibling No other history of breast cancer in the family that she is aware of.   ALLERGIES:  is allergic to shrimp [shellfish allergy].  MEDICATIONS:  Current Outpatient Prescriptions  Medication Sig Dispense Refill  . ALPRAZolam (XANAX) 0.25 MG tablet Take 0.25 mg by mouth at bedtime as needed for anxiety.    . cetirizine (ZYRTEC) 10 MG tablet Take 10 mg by mouth daily.    Marland Kitchen HYDROcodone-acetaminophen (NORCO) 10-325 MG tablet Take 1 tablet by mouth every 6 (six) hours as needed. 20 tablet 0  . losartan (COZAAR) 50 MG tablet Take 50 mg by mouth daily.    . Probiotic Product (PROBIOTIC ADVANCED PO) Take by mouth.    . tamoxifen (NOLVADEX) 20 MG tablet Take 1 tablet (20 mg total) by mouth daily. 30 tablet 3   No current facility-administered medications for this visit.     Review of Systems  Constitutional: Negative.        Hot flashes with Tamoxifen  HENT: Negative.   Eyes: Negative.   Respiratory: Negative.   Cardiovascular: Negative.   Genitourinary: Negative.   Musculoskeletal: Negative.        Little numbness in the right axillary region  Neurological: Negative.   Endo/Heme/Allergies: Negative.   Psychiatric/Behavioral: Negative.   All other systems reviewed and are negative. 14 point ROS was done and is otherwise as detailed  above or in HPI   PHYSICAL EXAMINATION: ECOG PERFORMANCE STATUS: 0 - Asymptomatic  Vitals:   10/01/16 1358  BP: (!) 110/51  Pulse: 85  Resp: 16  Temp: 98.2 F (36.8 C)   Filed Weights   10/01/16 1358  Weight: 208 lb 9.6 oz (94.6 kg)     Physical Exam  Constitutional: She is oriented to person, place, and time and well-developed, well-nourished, and in no distress.  HENT:  Head: Normocephalic and atraumatic.  Nose: Nose normal.  Mouth/Throat: Oropharynx is clear and moist. No oropharyngeal  exudate.  Eyes: Conjunctivae and EOM are normal. Pupils are equal, round, and reactive to light. Right eye exhibits no discharge. Left eye exhibits no discharge. No scleral icterus.  Neck: Normal range of motion. Neck supple. No tracheal deviation present. No thyromegaly present.  Cardiovascular: Normal rate, regular rhythm and normal heart sounds.  Exam reveals no gallop and no friction rub.   No murmur heard. Pulmonary/Chest: Effort normal and breath sounds normal. She has no wheezes. She has no rales.  Abdominal: Soft. Bowel sounds are normal. She exhibits no distension and no mass. There is no tenderness. There is no rebound and no guarding.  Musculoskeletal: Normal range of motion. She exhibits no edema.  Lymphadenopathy:    She has no cervical adenopathy.  Neurological: She is alert and oriented to person, place, and time. She has normal reflexes. No cranial nerve deficit. Gait normal. Coordination normal.  Skin: Skin is warm and dry. No rash noted.  Psychiatric: Mood, memory, affect and judgment normal.  Nursing note and vitals reviewed.   LABORATORY DATA:  I have reviewed the data as listed Lab Results  Component Value Date   WBC 6.2 10/01/2016   HGB 13.9 10/01/2016   HCT 40.3 10/01/2016   MCV 92.2 10/01/2016   PLT 228 10/01/2016   CMP     Component Value Date/Time   NA 137 10/01/2016 1320   K 3.6 10/01/2016 1320   CL 99 (L) 10/01/2016 1320   CO2 31 10/01/2016 1320    GLUCOSE 117 (H) 10/01/2016 1320   BUN 14 10/01/2016 1320   CREATININE 0.98 10/01/2016 1320   CALCIUM 9.4 10/01/2016 1320   PROT 7.5 10/01/2016 1320   ALBUMIN 4.0 10/01/2016 1320   AST 28 10/01/2016 1320   ALT 38 10/01/2016 1320   ALKPHOS 77 10/01/2016 1320   BILITOT 0.9 10/01/2016 1320   GFRNONAA >60 10/01/2016 1320   GFRAA >60 10/01/2016 1320     RADIOGRAPHIC STUDIES: I have personally reviewed the radiological images as listed and agreed with the findings in the report. No results found.  PATHOLOGY   ASSESSMENT & PLAN:  Right breast cancer upper inner quadrant, ER+ PR+ HER-2(-)  Stage IB [id number] Premenopausal Tamoxifen  Hot flashes on Tamoxifen but otherwise doing ok. We reviewed the risks and benefits of tamoxifen. These include but are not limited to insomnia, hot flashes, mood changes, vaginal dryness. Although rare, serious side effects including endometrial cancer, risk of blood clots were also discussed. We strongly believe that the benefits far outweigh the risks. Patient understands these risks and consented to starting treatment. Planned treatment duration is 5 - 10 years. She is to continue with yearly gynecologic visits while on Tamoxifen, this was emphasized to her today.   She has completed radiation therapy.   She is to continue on calcium and Vitamin D supplements for bone health.   Labs reviewed. Results are noted above.   She will return for follow up in 3 months.   All questions were answered. The patient knows to call the clinic with any problems, questions or concerns.  This document serves as a record of services personally performed by SAncil Linsey MD. It was created on her behalf by EArlyce Harman a trained medical scribe. The creation of this record is based on the scribe's personal observations and the provider's statements to them. This document has been checked and approved by the attending provider.  I have reviewed the above  documentation for accuracy and completeness and I agree with  the above.  This note was electronically signed.    Carlis Abbott  10/01/2016 2:28 PM

## 2016-10-02 ENCOUNTER — Telehealth (HOSPITAL_COMMUNITY): Payer: Self-pay | Admitting: *Deleted

## 2016-12-01 ENCOUNTER — Other Ambulatory Visit (HOSPITAL_COMMUNITY): Payer: Self-pay | Admitting: Hematology & Oncology

## 2016-12-01 DIAGNOSIS — Z5181 Encounter for therapeutic drug level monitoring: Secondary | ICD-10-CM

## 2016-12-01 DIAGNOSIS — N959 Unspecified menopausal and perimenopausal disorder: Secondary | ICD-10-CM

## 2016-12-01 DIAGNOSIS — Z79810 Long term (current) use of selective estrogen receptor modulators (SERMs): Secondary | ICD-10-CM

## 2017-01-06 ENCOUNTER — Encounter (HOSPITAL_COMMUNITY): Payer: Self-pay

## 2017-01-06 ENCOUNTER — Encounter (HOSPITAL_COMMUNITY): Payer: BLUE CROSS/BLUE SHIELD | Attending: Oncology | Admitting: Oncology

## 2017-01-06 ENCOUNTER — Encounter (HOSPITAL_COMMUNITY): Payer: BLUE CROSS/BLUE SHIELD

## 2017-01-06 VITALS — BP 133/86 | HR 86 | Temp 97.7°F | Resp 16 | Wt 208.2 lb

## 2017-01-06 DIAGNOSIS — R252 Cramp and spasm: Secondary | ICD-10-CM | POA: Insufficient documentation

## 2017-01-06 DIAGNOSIS — Z170 Estrogen receptor positive status [ER+]: Secondary | ICD-10-CM | POA: Diagnosis not present

## 2017-01-06 DIAGNOSIS — Z9889 Other specified postprocedural states: Secondary | ICD-10-CM | POA: Insufficient documentation

## 2017-01-06 DIAGNOSIS — Z800 Family history of malignant neoplasm of digestive organs: Secondary | ICD-10-CM | POA: Diagnosis not present

## 2017-01-06 DIAGNOSIS — E559 Vitamin D deficiency, unspecified: Secondary | ICD-10-CM | POA: Diagnosis not present

## 2017-01-06 DIAGNOSIS — Z8249 Family history of ischemic heart disease and other diseases of the circulatory system: Secondary | ICD-10-CM | POA: Insufficient documentation

## 2017-01-06 DIAGNOSIS — Z79899 Other long term (current) drug therapy: Secondary | ICD-10-CM | POA: Insufficient documentation

## 2017-01-06 DIAGNOSIS — Z9049 Acquired absence of other specified parts of digestive tract: Secondary | ICD-10-CM | POA: Insufficient documentation

## 2017-01-06 DIAGNOSIS — Z79810 Long term (current) use of selective estrogen receptor modulators (SERMs): Secondary | ICD-10-CM | POA: Diagnosis not present

## 2017-01-06 DIAGNOSIS — Z833 Family history of diabetes mellitus: Secondary | ICD-10-CM | POA: Diagnosis not present

## 2017-01-06 DIAGNOSIS — Z91013 Allergy to seafood: Secondary | ICD-10-CM | POA: Diagnosis not present

## 2017-01-06 DIAGNOSIS — Z923 Personal history of irradiation: Secondary | ICD-10-CM | POA: Insufficient documentation

## 2017-01-06 DIAGNOSIS — I10 Essential (primary) hypertension: Secondary | ICD-10-CM | POA: Insufficient documentation

## 2017-01-06 DIAGNOSIS — Z87891 Personal history of nicotine dependence: Secondary | ICD-10-CM | POA: Diagnosis not present

## 2017-01-06 DIAGNOSIS — K219 Gastro-esophageal reflux disease without esophagitis: Secondary | ICD-10-CM | POA: Diagnosis not present

## 2017-01-06 DIAGNOSIS — C50211 Malignant neoplasm of upper-inner quadrant of right female breast: Secondary | ICD-10-CM | POA: Diagnosis not present

## 2017-01-06 LAB — COMPREHENSIVE METABOLIC PANEL
ALK PHOS: 58 U/L (ref 38–126)
ALT: 50 U/L (ref 14–54)
ANION GAP: 8 (ref 5–15)
AST: 31 U/L (ref 15–41)
Albumin: 3.9 g/dL (ref 3.5–5.0)
BUN: 15 mg/dL (ref 6–20)
CALCIUM: 9.2 mg/dL (ref 8.9–10.3)
CHLORIDE: 101 mmol/L (ref 101–111)
CO2: 29 mmol/L (ref 22–32)
CREATININE: 1.03 mg/dL — AB (ref 0.44–1.00)
Glucose, Bld: 128 mg/dL — ABNORMAL HIGH (ref 65–99)
Potassium: 3.5 mmol/L (ref 3.5–5.1)
SODIUM: 138 mmol/L (ref 135–145)
Total Bilirubin: 0.8 mg/dL (ref 0.3–1.2)
Total Protein: 6.9 g/dL (ref 6.5–8.1)

## 2017-01-06 LAB — CBC WITH DIFFERENTIAL/PLATELET
Basophils Absolute: 0 10*3/uL (ref 0.0–0.1)
Basophils Relative: 0 %
EOS ABS: 0.3 10*3/uL (ref 0.0–0.7)
Eosinophils Relative: 6 %
HCT: 39.9 % (ref 36.0–46.0)
Hemoglobin: 13.6 g/dL (ref 12.0–15.0)
LYMPHS ABS: 1.5 10*3/uL (ref 0.7–4.0)
LYMPHS PCT: 28 %
MCH: 31.4 pg (ref 26.0–34.0)
MCHC: 34.1 g/dL (ref 30.0–36.0)
MCV: 92.1 fL (ref 78.0–100.0)
MONOS PCT: 6 %
Monocytes Absolute: 0.3 10*3/uL (ref 0.1–1.0)
Neutro Abs: 3.3 10*3/uL (ref 1.7–7.7)
Neutrophils Relative %: 60 %
PLATELETS: 199 10*3/uL (ref 150–400)
RBC: 4.33 MIL/uL (ref 3.87–5.11)
RDW: 12.1 % (ref 11.5–15.5)
WBC: 5.4 10*3/uL (ref 4.0–10.5)

## 2017-01-06 NOTE — Patient Instructions (Signed)
[facility] at [facility] Discharge Instructions  RECOMMENDATIONS MADE BY THE CONSULTANT AND ANY TEST RESULTS WILL BE SENT TO YOUR REFERRING PHYSICIAN.  You were seen today by Dr. Twana First You will get lab work today and we will call you with the results Follow up in 6 months with lab work See Amy up front for appointments   Thank you for choosing [facility] at [facility] to provide your oncology and hematology care.  To afford each patient quality time with our provider, please arrive at least 15 minutes before your scheduled appointment time.    If you have a lab appointment with the [facility] please come in thru the  Main Entrance and check in at the main information desk  You need to re-schedule your appointment should you arrive 10 or more minutes late.  We strive to give you quality time with our providers, and arriving late affects you and other patients whose appointments are after yours.  Also, if you no show three or more times for appointments you may be dismissed from the clinic at the providers discretion.     Again, thank you for choosing [facility].  Our hope is that these requests will decrease the amount of time that you wait before being seen by our physicians.       _____________________________________________________________  Should you have questions after your visit to [facility], please contact our office at [phone] between the hours of 8:30 a.m. and 4:30 p.m.  Voicemails left after 4:30 p.m. will not be returned until the following business day.  For prescription refill requests, have your pharmacy contact our office.       Resources For Cancer Patients and their Caregivers ? American Cancer Society: Can assist with transportation, wigs, general needs, runs Look Good Feel Better.        236-473-0020 ? Cancer Care: Provides financial assistance, online support groups,  medication/co-pay assistance.  1-800-813-HOPE 850-624-0093) ? [facility] Assists [place] cancer patients and their families through emotional , educational and financial support.  (585)448-6055 ? [place] DSS Where to apply for food stamps, Medicaid and utility assistance. 4191682179 ? [facility]: Transportation to medical appointments. 5027431479 ? Social Security Administration: May apply for disability if have a Stage IV cancer. 402-769-4843 347-845-3868 ? [employer], Disability and Transit Services: Assists with nutrition, care and transit needs. [facility] Support Programs: @10RELATIVEDAYS @ > Cancer Support Group  2nd Tuesday of the month 1pm-2pm, Journey Room  > Creative Journey  3rd Tuesday of the month 1130am-1pm, Journey Room  > Look Good Feel Better  1st Wednesday of the month 10am-12 noon, Journey Room (Call [facility] to register [phone])

## 2017-01-06 NOTE — Progress Notes (Signed)
[facility]  Progress Note  Patient Care Team: No Pcp Per Patient as PCP - General (General Practice)  CHIEF COMPLAINTS:  Right breast cancer, ER+ PR+ HER-2(-)     Breast cancer of upper-inner quadrant of right female breast ([place])   03/28/2016 Mammogram    1.9 cm irregular spiculated mass in the upper inner quadrant of right breast.      04/19/2016 Pathology Results    [facility] pathology-infiltrating ductal carcinoma of right breast.      05/03/2016 Pathology Results    [place] pathology consult- Consult Slide , right breast biopsy - INVASIVE DUCTAL CARCINOMA. ER (95%, strong staining intensity) and PR (95%, strong staining intensity). Her2 is negative (1+). Ki-67 reveals a proliferation rate of 30% and p53 is negative.      05/09/2016 Procedure    Right axillary lymph node biopsy      05/09/2016 Pathology Results    Lymph node, needle/core biopsy, right axillary LYMPHOID TISSUE, NEGATIVE FOR CARCINOMA.      05/15/2016 Surgery    R breast seed guided lumpectomy, R axillary SN biopsy      05/15/2016 Pathology Results    invasive ductal carcinoma 2 cm, sentinel node micrometastatic carcinoma 0.2 mm, total 3 sentinel nodes 1/3 with micromets, ER 95%, PR 95%, Her 2 neu - Ki-67 30% pT1c, pN1MipNX       Radiation Therapy     The patient saw No care team member to display for radiation treatment. This is the current list of radiation treatment: Radiation Treatments       Patient's record has no active or historical radiation treatments documented.             05/21/2016 Oncotype testing    Recurrence score results of 14, 10 year risk of distant recurrence with tamoxifen alone 9%       HISTORY OF PRESENTING ILLNESS:  Rachael Villa 51 y.o. female is here for additional follow-up of Right breast cancer, ER+ PR+ HER-2(-), stage IB with micrometastatic disease to a sentinel LN. She has completed XRT. She has started tamoxifen.   The patient's previous hot flashes with  Tamoxifen have resolved. She states she was previously taking Vitamin D gummies but did not like taste and has not taken for a month. Intermittent leg cramps at night while sleeping. Otherwise she is well with no new complaints.   MEDICAL HISTORY:  Past Medical History:  Diagnosis Date  . Breast cancer of upper-outer quadrant of right female breast ([place]) 05/03/2016  . GERD (gastroesophageal reflux disease)    less since gallbladder removed  . Headache    while on BCP  . Hypertension     SURGICAL HISTORY: Past Surgical History:  Procedure Laterality Date  . CHOLECYSTECTOMY  2011  . HERNIA REPAIR  2017   UHR  . RADIOACTIVE SEED GUIDED MASTECTOMY WITH AXILLARY SENTINEL LYMPH NODE BIOPSY Right 05/15/2016   Procedure: RADIOACTIVE SEED GUIDED RIGHT BREAST LUMPECTOMY WITH AXILLARY SENTINEL LYMPH NODE BIOPSY;  Surgeon: Rolm Bookbinder, MD;  Location: [place];  Service: General;  Laterality: Right;  . TUBAL LIGATION    . WISDOM TOOTH EXTRACTION      SOCIAL HISTORY: Social History   Social History  . Marital status: Married    Spouse name: N/A  . Number of children: N/A  . Years of education: N/A   Occupational History  . Not on file.   Social History Main Topics  . Smoking status: Former Research scientist (life sciences)  . Smokeless  tobacco: Never Used  . Alcohol use Yes     Comment: social  . Drug use: No  . Sexual activity: Yes    Birth control/ protection: Surgical     Comment: TL   Other Topics Concern  . Not on file   Social History Narrative  . 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 Diabetes Mother   . Hypertension Mother   . Cirrhosis Mother     NAFLD  . Hypertension Father     . Cancer Father     colon    Mother is still living and mostly healthy. Father deceased at 50 yo of colon cancer and related issues.  1 sibling No other history of breast cancer in the family that she is aware of.   ALLERGIES:  is allergic to shrimp [shellfish allergy].  MEDICATIONS:  Current Outpatient Prescriptions  Medication Sig Dispense Refill  . ALPRAZolam (XANAX) 0.25 MG tablet Take 0.25 mg by mouth at bedtime as needed for anxiety.    . Ca Phosphate-Cholecalciferol (CALCIUM/VITAMIN D3 GUMMIES PO) Take 800 Int'l Units by mouth daily. Calcium = 523m  Vit D = 800iu    . cetirizine (ZYRTEC) 10 MG tablet Take 10 mg by mouth daily.    .Marland KitchenHYDROcodone-acetaminophen (NORCO) 10-325 MG tablet Take 1 tablet by mouth every 6 (six) hours as needed. 20 tablet 0  . losartan (COZAAR) 50 MG tablet Take 50 mg by mouth daily.    . Potassium 95 MG TABS Take 99 mg by mouth daily.    . Probiotic Product (PROBIOTIC ADVANCED PO) Take by mouth.    . tamoxifen (NOLVADEX) 20 MG tablet TAKE 1 TABLET BY MOUTH EVERY DAY 30 tablet 3   No current facility-administered medications for this visit.     Review of Systems  Constitutional: Negative.   HENT: Negative.   Eyes: Negative.   Respiratory: Negative.   Cardiovascular: Negative.   Gastrointestinal: Negative.   Genitourinary: Negative.   Musculoskeletal: Negative.        Intermittent leg cramps at night while sleeping.  Skin: Negative.   Neurological: Negative.   Endo/Heme/Allergies: Negative.   Psychiatric/Behavioral: Negative.   All other systems reviewed and are negative. 14 point ROS was done and is otherwise as detailed above or in HPI   PHYSICAL EXAMINATION: ECOG PERFORMANCE STATUS: 0 - Asymptomatic  Vitals:   01/06/17 1436  BP: 133/86  Pulse: 86  Resp: 16  Temp: 97.7 F (36.5 C)   Filed Weights   01/06/17 1436  Weight: 208 lb 3.2 oz (94.4 kg)     Physical Exam  Constitutional: She is oriented to person, place, and time  and well-developed, well-nourished, and in no distress.  HENT:  Head: Normocephalic and atraumatic.  Nose: Nose normal.  Mouth/Throat: Oropharynx is clear and moist. No oropharyngeal exudate.  Eyes: Conjunctivae and EOM are normal. Pupils are equal, round, and reactive to light. Right eye exhibits no discharge. Left eye exhibits no discharge. No scleral icterus.  Neck: Normal range of motion. Neck supple. No tracheal deviation present. No thyromegaly present.  Cardiovascular: Normal rate, regular rhythm and normal heart sounds.  Exam reveals no gallop and no  friction rub.   No murmur heard. Pulmonary/Chest: Effort normal and breath sounds normal. She has no wheezes. She has no rales.    Abdominal: Soft. Bowel sounds are normal. She exhibits no distension and no mass. There is no tenderness. There is no rebound and no guarding.  Musculoskeletal: Normal range of motion. She exhibits no edema.  Lymphadenopathy:    She has no cervical adenopathy.  Neurological: She is alert and oriented to person, place, and time. She has normal reflexes. No cranial nerve deficit. Gait normal. Coordination normal.  Skin: Skin is warm and dry. No rash noted.  Psychiatric: Mood, memory, affect and judgment normal.  Nursing note and vitals reviewed.   LABORATORY DATA:  I have reviewed the data as listed Lab Results  Component Value Date   WBC 6.2 10/01/2016   HGB 13.9 10/01/2016   HCT 40.3 10/01/2016   MCV 92.2 10/01/2016   PLT 228 10/01/2016   CMP     Component Value Date/Time   NA 137 10/01/2016 1320   K 3.6 10/01/2016 1320   CL 99 (L) 10/01/2016 1320   CO2 31 10/01/2016 1320   GLUCOSE 117 (H) 10/01/2016 1320   BUN 14 10/01/2016 1320   CREATININE 0.98 10/01/2016 1320   CALCIUM 9.4 10/01/2016 1320   PROT 7.5 10/01/2016 1320   ALBUMIN 4.0 10/01/2016 1320   AST 28 10/01/2016 1320   ALT 38 10/01/2016 1320   ALKPHOS 77 10/01/2016 1320   BILITOT 0.9 10/01/2016 1320   GFRNONAA >60 10/01/2016 1320    GFRAA >60 10/01/2016 1320     RADIOGRAPHIC STUDIES: I have personally reviewed the radiological images as listed and agreed with the findings in the report. No results found.  PATHOLOGY   ASSESSMENT & PLAN:  Right breast cancer upper inner quadrant, ER+ PR+ HER-2(-)  Stage IB [id number] Premenopausal Tamoxifen Vitamin D deficiency  She will get her next mammogram in May 2018. Clinically NED on breast exam.  Continue Tamoxifen 20 mg daily. Advised her to take oral Vitamin D 2000 IU daily. For her leg cramps, I advised her to make sure she maintains hydration and we will check her electrolytes today.  Check CBC CMP and Vitamin D level today and prior to next visit.   RTC in 6 months for repeat exam.  All questions were answered. The patient knows to call the clinic with any problems, questions or concerns.  This document serves as a record of services personally performed by LTwana First MD. It was created on her behalf by SMaryla Morrow a trained medical scribe. The creation of this record is based on the scribe's personal observations and the provider's statements to them. This document has been checked and approved by the attending provider.  I have reviewed the above documentation for accuracy and completeness and I agree with the above.  This note was electronically signed.    SMaryla Morrow 01/06/2017 3:06 PM

## 2017-01-07 LAB — VITAMIN D 25 HYDROXY (VIT D DEFICIENCY, FRACTURES): VIT D 25 HYDROXY: 27.1 ng/mL — AB (ref 30.0–100.0)

## 2017-04-05 ENCOUNTER — Other Ambulatory Visit (HOSPITAL_COMMUNITY): Payer: Self-pay | Admitting: Hematology & Oncology

## 2017-04-05 DIAGNOSIS — N959 Unspecified menopausal and perimenopausal disorder: Secondary | ICD-10-CM

## 2017-04-05 DIAGNOSIS — Z79810 Long term (current) use of selective estrogen receptor modulators (SERMs): Secondary | ICD-10-CM

## 2017-04-05 DIAGNOSIS — Z5181 Encounter for therapeutic drug level monitoring: Secondary | ICD-10-CM

## 2017-07-07 ENCOUNTER — Ambulatory Visit (HOSPITAL_COMMUNITY): Payer: BLUE CROSS/BLUE SHIELD

## 2017-07-07 ENCOUNTER — Other Ambulatory Visit (HOSPITAL_COMMUNITY): Payer: BLUE CROSS/BLUE SHIELD

## 2017-08-06 ENCOUNTER — Encounter (HOSPITAL_BASED_OUTPATIENT_CLINIC_OR_DEPARTMENT_OTHER): Payer: BLUE CROSS/BLUE SHIELD | Admitting: Oncology

## 2017-08-06 ENCOUNTER — Encounter (HOSPITAL_COMMUNITY): Payer: BLUE CROSS/BLUE SHIELD | Attending: Oncology

## 2017-08-06 ENCOUNTER — Encounter (HOSPITAL_COMMUNITY): Payer: Self-pay

## 2017-08-06 VITALS — BP 142/80 | HR 84 | Resp 16 | Ht 65.0 in | Wt 206.5 lb

## 2017-08-06 DIAGNOSIS — Z79810 Long term (current) use of selective estrogen receptor modulators (SERMs): Secondary | ICD-10-CM

## 2017-08-06 DIAGNOSIS — E559 Vitamin D deficiency, unspecified: Secondary | ICD-10-CM

## 2017-08-06 DIAGNOSIS — Z170 Estrogen receptor positive status [ER+]: Secondary | ICD-10-CM | POA: Insufficient documentation

## 2017-08-06 DIAGNOSIS — C50211 Malignant neoplasm of upper-inner quadrant of right female breast: Secondary | ICD-10-CM | POA: Diagnosis not present

## 2017-08-06 DIAGNOSIS — N959 Unspecified menopausal and perimenopausal disorder: Secondary | ICD-10-CM

## 2017-08-06 DIAGNOSIS — C773 Secondary and unspecified malignant neoplasm of axilla and upper limb lymph nodes: Secondary | ICD-10-CM

## 2017-08-06 DIAGNOSIS — Z5181 Encounter for therapeutic drug level monitoring: Secondary | ICD-10-CM

## 2017-08-06 LAB — CBC WITH DIFFERENTIAL/PLATELET
BASOS ABS: 0 10*3/uL (ref 0.0–0.1)
BASOS PCT: 0 %
EOS ABS: 0.3 10*3/uL (ref 0.0–0.7)
Eosinophils Relative: 5 %
HCT: 40.1 % (ref 36.0–46.0)
Hemoglobin: 13.5 g/dL (ref 12.0–15.0)
Lymphocytes Relative: 29 %
Lymphs Abs: 1.4 10*3/uL (ref 0.7–4.0)
MCH: 31.5 pg (ref 26.0–34.0)
MCHC: 33.7 g/dL (ref 30.0–36.0)
MCV: 93.7 fL (ref 78.0–100.0)
MONO ABS: 0.3 10*3/uL (ref 0.1–1.0)
Monocytes Relative: 6 %
Neutro Abs: 2.9 10*3/uL (ref 1.7–7.7)
Neutrophils Relative %: 60 %
PLATELETS: 194 10*3/uL (ref 150–400)
RBC: 4.28 MIL/uL (ref 3.87–5.11)
RDW: 12.5 % (ref 11.5–15.5)
WBC: 4.9 10*3/uL (ref 4.0–10.5)

## 2017-08-06 LAB — COMPREHENSIVE METABOLIC PANEL
ALBUMIN: 4.2 g/dL (ref 3.5–5.0)
ALT: 81 U/L — AB (ref 14–54)
AST: 57 U/L — AB (ref 15–41)
Alkaline Phosphatase: 59 U/L (ref 38–126)
Anion gap: 10 (ref 5–15)
BUN: 10 mg/dL (ref 6–20)
CHLORIDE: 101 mmol/L (ref 101–111)
CO2: 26 mmol/L (ref 22–32)
Calcium: 9.3 mg/dL (ref 8.9–10.3)
Creatinine, Ser: 1.01 mg/dL — ABNORMAL HIGH (ref 0.44–1.00)
GFR calc Af Amer: >60 mL/min (ref >60)
Glucose, Bld: 123 mg/dL — ABNORMAL HIGH (ref 65–99)
POTASSIUM: 3.7 mmol/L (ref 3.5–5.1)
SODIUM: 137 mmol/L (ref 135–145)
Total Bilirubin: 0.8 mg/dL (ref 0.3–1.2)
Total Protein: 7.4 g/dL (ref 6.5–8.1)

## 2017-08-06 MED ORDER — TAMOXIFEN CITRATE 20 MG PO TABS: 20.0000 mg | ORAL_TABLET | Freq: Every day | ORAL | 3 refills | Status: DC

## 2017-08-06 NOTE — Progress Notes (Signed)
[facility]  Progress Note  Patient Care Team: Patient, No Pcp Per as PCP - General (General Practice)  CHIEF COMPLAINTS:  Right breast cancer, ER+ PR+ HER-2(-)     Breast cancer of upper-inner quadrant of right female breast ([facility])   03/28/2016 Mammogram    1.9 cm irregular spiculated mass in the upper inner quadrant of right breast.      04/19/2016 Pathology Results    [facility] pathology-infiltrating ductal carcinoma of right breast.      05/03/2016 Pathology Results    [facility] pathology consult- Consult Slide , right breast biopsy - INVASIVE DUCTAL CARCINOMA. ER (95%, strong staining intensity) and PR (95%, strong staining intensity). Her2 is negative (1+). Ki-67 reveals a proliferation rate of 30% and p53 is negative.      05/09/2016 Procedure    Right axillary lymph node biopsy      05/09/2016 Pathology Results    Lymph node, needle/core biopsy, right axillary LYMPHOID TISSUE, NEGATIVE FOR CARCINOMA.      05/15/2016 Surgery    R breast seed guided lumpectomy, R axillary SN biopsy      05/15/2016 Pathology Results    invasive ductal carcinoma 2 cm, sentinel node micrometastatic carcinoma 0.2 mm, total 3 sentinel nodes 1/3 with micromets, ER 95%, PR 95%, Her 2 neu - Ki-67 30% pT1c, pN1MipNX       Radiation Therapy     The patient saw No care team member to display for radiation treatment. This is the current list of radiation treatment: Radiation Treatments       Patient's record has no active or historical radiation treatments documented.             05/21/2016 Oncotype testing    Recurrence score results of 14, 10 year risk of distant recurrence with tamoxifen alone 9%       HISTORY OF PRESENTING ILLNESS:  [facility] 52 y.o. female is here for additional follow-up of Right breast cancer, ER+ PR+ HER-2(-), stage IB with micrometastatic disease to a sentinel LN. She has completed XRT. She is on tamoxifen.   Patient presents with her husband today for  continued follow up. She has been doing well. She had her last mammogram in May 2018 in [place], [place]. It was ordered by her gynecologist and it was negative for malignancy. She no longer has hot flashes with the tamoxifen and is tolerating it well. She states she has not had a menstrual cycle for a year now. Otherwise she is well with no new complaints.   MEDICAL HISTORY:  Past Medical History:  Diagnosis Date  . Breast cancer of upper-outer quadrant of right female breast ([place]) 05/03/2016  . GERD (gastroesophageal reflux disease)    less since gallbladder removed  . Headache    while on BCP  . Hypertension     SURGICAL HISTORY: Past Surgical History:  Procedure Laterality Date  . CHOLECYSTECTOMY  2011  . HERNIA REPAIR  2017   UHR  . RADIOACTIVE SEED GUIDED MASTECTOMY WITH AXILLARY SENTINEL LYMPH NODE BIOPSY Right 05/15/2016   Procedure: RADIOACTIVE SEED GUIDED RIGHT BREAST LUMPECTOMY WITH AXILLARY SENTINEL LYMPH NODE BIOPSY;  Surgeon: Rachael Bookbinder, MD;  Location: [facility];  Service: General;  Laterality: Right;  . TUBAL LIGATION    . WISDOM TOOTH EXTRACTION      SOCIAL HISTORY: Social History   Social History  . Marital status: Married    Spouse name: N/A  . Number of children: N/A  .  Years of education: N/A   Occupational History  . Not on file.   Social History Main Topics  . Smoking status: Former Research scientist (life sciences)  . Smokeless tobacco: Never Used  . Alcohol use Yes     Comment: social  . Drug use: No  . Sexual activity: Yes    Birth control/ protection: Surgical     Comment: TL   Other Topics Concern  . Not on file   Social History Narrative  . 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 Diabetes Mother   . Hypertension Mother   . Cirrhosis Mother        NAFLD  . Hypertension Father   . Cancer Father        colon    Mother is still living and mostly healthy. Father deceased at 38 yo of colon cancer and related issues.  1 sibling No other history of breast cancer in the family that she is aware of.   ALLERGIES:  is allergic to shrimp [shellfish allergy].  MEDICATIONS:  Current Outpatient Prescriptions  Medication Sig Dispense Refill  . ALPRAZolam (XANAX) 0.25 MG tablet Take 0.25 mg by mouth at bedtime as needed for anxiety.    . Ca Phosphate-Cholecalciferol (CALCIUM/VITAMIN D3 GUMMIES PO) Take 800 Int'l Units by mouth daily. Calcium = 554m  Vit D = 800iu    . cetirizine (ZYRTEC) 10 MG tablet Take 10 mg by mouth daily.    .Marland Kitchenlosartan (COZAAR) 50 MG tablet Take 50 mg by mouth daily.    . Potassium 95 MG TABS Take 99 mg by mouth daily.    . Probiotic Product (PROBIOTIC ADVANCED PO) Take by mouth.    . tamoxifen (NOLVADEX) 20 MG tablet TAKE 1 TABLET BY MOUTH EVERY DAY 30 tablet 3   No current facility-administered medications for this visit.     Review of Systems  Constitutional: Negative.   HENT: Negative.   Eyes: Negative.   Respiratory: Negative.   Cardiovascular: Negative.   Gastrointestinal: Negative.   Genitourinary: Negative.   Musculoskeletal: Negative.        Intermittent leg cramps at night while sleeping.  Skin: Negative.   Neurological: Negative.   Endo/Heme/Allergies: Negative.   Psychiatric/Behavioral: Negative.   All other systems reviewed and are negative. 14 point ROS was done and is otherwise as detailed above or in HPI   PHYSICAL EXAMINATION: ECOG PERFORMANCE STATUS: 0 - Asymptomatic  Vitals:   08/06/17 1442  BP: (!) 142/80  Pulse: 84  Resp: 16  SpO2: 100%   Filed Weights   08/06/17 1442  Weight: 206 lb 8 oz (93.7 kg)     Physical Exam  Constitutional: She is oriented to person, place, and time  and well-developed, well-nourished, and in no distress.  HENT:  Head: Normocephalic and atraumatic.  Nose: Nose normal.  Mouth/Throat: Oropharynx is clear and moist. No oropharyngeal exudate.  Eyes: Pupils are equal, round, and reactive to light. Conjunctivae and EOM are normal. Right eye exhibits no discharge. Left eye exhibits no discharge. No scleral icterus.  Neck: Normal range of motion. Neck supple. No tracheal deviation present. No thyromegaly present.  Cardiovascular: Normal rate, regular rhythm and  normal heart sounds.  Exam reveals no gallop and no friction rub.   No murmur heard. Pulmonary/Chest: Effort normal and breath sounds normal. She has no wheezes. She has no rales.    Abdominal: Soft. Bowel sounds are normal. She exhibits no distension and no mass. There is no tenderness. There is no rebound and no guarding.  Musculoskeletal: Normal range of motion. She exhibits no edema.  Lymphadenopathy:    She has no cervical adenopathy.  Neurological: She is alert and oriented to person, place, and time. She has normal reflexes. No cranial nerve deficit. Gait normal. Coordination normal.  Skin: Skin is warm and dry. No rash noted.  Psychiatric: Mood, memory, affect and judgment normal.  Nursing note and vitals reviewed.   LABORATORY DATA:  I have reviewed the data as listed Lab Results  Component Value Date   WBC 4.9 08/06/2017   HGB 13.5 08/06/2017   HCT 40.1 08/06/2017   MCV 93.7 08/06/2017   PLT 194 08/06/2017   CMP     Component Value Date/Time   NA 138 01/06/2017 1500   K 3.5 01/06/2017 1500   CL 101 01/06/2017 1500   CO2 29 01/06/2017 1500   GLUCOSE 128 (H) 01/06/2017 1500   BUN 15 01/06/2017 1500   CREATININE 1.03 (H) 01/06/2017 1500   CALCIUM 9.2 01/06/2017 1500   PROT 6.9 01/06/2017 1500   ALBUMIN 3.9 01/06/2017 1500   AST 31 01/06/2017 1500   ALT 50 01/06/2017 1500   ALKPHOS 58 01/06/2017 1500   BILITOT 0.8 01/06/2017 1500   GFRNONAA >60 01/06/2017  1500   GFRAA >60 01/06/2017 1500     RADIOGRAPHIC STUDIES: I have personally reviewed the radiological images as listed and agreed with the findings in the report. No results found.  PATHOLOGY   ASSESSMENT & PLAN:  Right breast cancer upper inner quadrant, ER+ PR+ HER-2(-)  Stage IB [id number] Premenopausal Tamoxifen Vitamin D deficiency  Clinically NED on breast exam. [place] Continue Tamoxifen 20 mg daily. Refill given today. Checking FSH, LH levels on her next visit. If her hormone levels confirm that she is definitely postmenopausal, then will plan to switch her to an AI.  RTC in 6 months for repeat exam and labs.   Orders Placed This Encounter  Procedures  . Follicle stimulating hormone    Standing Status:   Future    Standing Expiration Date:   08/06/2018  . Luteinizing hormone    Standing Status:   Future    Standing Expiration Date:   08/06/2018  . CBC with Differential    Standing Status:   Future    Standing Expiration Date:   08/06/2018  . Comprehensive metabolic panel    Standing Status:   Future    Standing Expiration Date:   08/06/2018    All questions were answered. The patient knows to call the clinic with any problems, questions or concerns.  This note was electronically signed.    LTwana First MD  08/06/2017 2:37 PM

## 2017-08-07 LAB — VITAMIN D 25 HYDROXY (VIT D DEFICIENCY, FRACTURES): Vit D, 25-Hydroxy: 34.9 ng/mL (ref 30.0–100.0)

## 2017-12-09 ENCOUNTER — Telehealth (HOSPITAL_COMMUNITY): Payer: Self-pay

## 2017-12-09 DIAGNOSIS — Z79810 Long term (current) use of selective estrogen receptor modulators (SERMs): Secondary | ICD-10-CM

## 2017-12-09 DIAGNOSIS — N959 Unspecified menopausal and perimenopausal disorder: Secondary | ICD-10-CM

## 2017-12-09 DIAGNOSIS — Z5181 Encounter for therapeutic drug level monitoring: Secondary | ICD-10-CM

## 2017-12-09 MED ORDER — TAMOXIFEN CITRATE 20 MG PO TABS: 20.0000 mg | ORAL_TABLET | Freq: Every day | ORAL | 3 refills | Status: DC

## 2017-12-09 NOTE — Telephone Encounter (Signed)
Patient called stating she needed a refill on her tamoxifen. Reviewed with provider, chart checked and refilled.

## 2018-02-03 ENCOUNTER — Other Ambulatory Visit (HOSPITAL_COMMUNITY): Payer: Self-pay | Admitting: *Deleted

## 2018-02-03 DIAGNOSIS — Z170 Estrogen receptor positive status [ER+]: Principal | ICD-10-CM

## 2018-02-03 DIAGNOSIS — C50911 Malignant neoplasm of unspecified site of right female breast: Secondary | ICD-10-CM

## 2018-02-04 ENCOUNTER — Inpatient Hospital Stay (HOSPITAL_COMMUNITY): Payer: BLUE CROSS/BLUE SHIELD | Attending: Internal Medicine

## 2018-02-04 ENCOUNTER — Inpatient Hospital Stay (HOSPITAL_BASED_OUTPATIENT_CLINIC_OR_DEPARTMENT_OTHER): Payer: BLUE CROSS/BLUE SHIELD | Admitting: Internal Medicine

## 2018-02-04 ENCOUNTER — Encounter (HOSPITAL_COMMUNITY): Payer: Self-pay | Admitting: Internal Medicine

## 2018-02-04 VITALS — BP 152/88 | HR 91 | Temp 98.6°F | Resp 16 | Wt 204.2 lb

## 2018-02-04 DIAGNOSIS — Z79810 Long term (current) use of selective estrogen receptor modulators (SERMs): Secondary | ICD-10-CM

## 2018-02-04 DIAGNOSIS — C773 Secondary and unspecified malignant neoplasm of axilla and upper limb lymph nodes: Secondary | ICD-10-CM | POA: Insufficient documentation

## 2018-02-04 DIAGNOSIS — N912 Amenorrhea, unspecified: Secondary | ICD-10-CM | POA: Insufficient documentation

## 2018-02-04 DIAGNOSIS — I10 Essential (primary) hypertension: Secondary | ICD-10-CM | POA: Insufficient documentation

## 2018-02-04 DIAGNOSIS — Z87891 Personal history of nicotine dependence: Secondary | ICD-10-CM | POA: Insufficient documentation

## 2018-02-04 DIAGNOSIS — Z170 Estrogen receptor positive status [ER+]: Secondary | ICD-10-CM | POA: Insufficient documentation

## 2018-02-04 DIAGNOSIS — C50211 Malignant neoplasm of upper-inner quadrant of right female breast: Secondary | ICD-10-CM

## 2018-02-04 DIAGNOSIS — C50911 Malignant neoplasm of unspecified site of right female breast: Secondary | ICD-10-CM

## 2018-02-04 LAB — COMPREHENSIVE METABOLIC PANEL
ALBUMIN: 4 g/dL (ref 3.5–5.0)
ALK PHOS: 59 U/L (ref 38–126)
ALT: 64 U/L — AB (ref 14–54)
AST: 40 U/L (ref 15–41)
Anion gap: 10 (ref 5–15)
BILIRUBIN TOTAL: 1 mg/dL (ref 0.3–1.2)
BUN: 14 mg/dL (ref 6–20)
CALCIUM: 9.7 mg/dL (ref 8.9–10.3)
CO2: 26 mmol/L (ref 22–32)
CREATININE: 0.95 mg/dL (ref 0.44–1.00)
Chloride: 102 mmol/L (ref 101–111)
GFR calc Af Amer: >60 mL/min (ref >60)
GLUCOSE: 99 mg/dL (ref 65–99)
POTASSIUM: 4 mmol/L (ref 3.5–5.1)
Sodium: 138 mmol/L (ref 135–145)
TOTAL PROTEIN: 7.4 g/dL (ref 6.5–8.1)

## 2018-02-04 LAB — CBC WITH DIFFERENTIAL/PLATELET
BASOS ABS: 0 10*3/uL (ref 0.0–0.1)
Basophils Relative: 1 %
Eosinophils Absolute: 0.3 10*3/uL (ref 0.0–0.7)
Eosinophils Relative: 5 %
HEMATOCRIT: 38.6 % (ref 36.0–46.0)
HEMOGLOBIN: 12.9 g/dL (ref 12.0–15.0)
LYMPHS PCT: 31 %
Lymphs Abs: 1.8 10*3/uL (ref 0.7–4.0)
MCH: 31.1 pg (ref 26.0–34.0)
MCHC: 33.4 g/dL (ref 30.0–36.0)
MCV: 93 fL (ref 78.0–100.0)
MONO ABS: 0.3 10*3/uL (ref 0.1–1.0)
MONOS PCT: 6 %
NEUTROS ABS: 3.3 10*3/uL (ref 1.7–7.7)
NEUTROS PCT: 57 %
Platelets: 190 10*3/uL (ref 150–400)
RBC: 4.15 MIL/uL (ref 3.87–5.11)
RDW: 12.3 % (ref 11.5–15.5)
WBC: 5.7 10*3/uL (ref 4.0–10.5)

## 2018-02-04 NOTE — Patient Instructions (Addendum)
[facility] at [facility]  Discharge Instructions:  We will call you about your hormonal levels.  If you do not hear from [place] in a few days give [place] a phone call for the results.   If we change you to Femara for your cancer we will need to check your bone density for a baseline to watch for any changes.  Information about Femara is included.     Letrozole tablets What is this medicine? LETROZOLE (LET roe zole) blocks the production of estrogen. It is used to treat breast cancer. This medicine may be used for other purposes; ask your health care provider or pharmacist if you have questions. COMMON BRAND NAME(S): Femara What should I tell my health care provider before I take this medicine? They need to know if you have any of these conditions: -high cholesterol -liver disease -osteoporosis (weak bones) -an unusual or allergic reaction to letrozole, other medicines, foods, dyes, or preservatives -pregnant or trying to get pregnant -breast-feeding How should I use this medicine? Take this medicine by mouth with a glass of water. You may take it with or without food. Follow the directions on the prescription label. Take your medicine at regular intervals. Do not take your medicine more often than directed. Do not stop taking except on your doctor's advice. Talk to your pediatrician regarding the use of this medicine in children. Special care may be needed. Overdosage: If you think you have taken too much of this medicine contact a poison control center or emergency room at once. NOTE: This medicine is only for you. Do not share this medicine with others. What if I miss a dose? If you miss a dose, take it as soon as you can. If it is almost time for your next dose, take only that dose. Do not take double or extra doses. What may interact with this medicine? Do not take this medicine with any of the following medications: -estrogens, like hormone replacement therapy or  birth control pills This medicine may also interact with the following medications: -dietary supplements such as androstenedione or DHEA -prasterone -tamoxifen This list may not describe all possible interactions. Give your health care provider a list of all the medicines, herbs, non-prescription drugs, or dietary supplements you use. Also tell them if you smoke, drink alcohol, or use illegal drugs. Some items may interact with your medicine. What should I watch for while using this medicine? Tell your doctor or healthcare professional if your symptoms do not start to get better or if they get worse. Do not become pregnant while taking this medicine or for 3 weeks after stopping it. Women should inform their doctor if they wish to become pregnant or think they might be pregnant. There is a potential for serious side effects to an unborn child. Talk to your health care professional or pharmacist for more information. Do not breast-feed while taking this medicine or for 3 weeks after stopping it. This medicine may interfere with the ability to have a child. Talk with your doctor or health care professional if you are concerned about your fertility. Using this medicine for a long time may increase your risk of low bone mass. Talk to your doctor about bone health. You may get drowsy or dizzy. Do not drive, use machinery, or do anything that needs mental alertness until you know how this medicine affects you. Do not stand or sit up quickly, especially if you are an older patient. This reduces the risk  of dizzy or fainting spells. You may need blood work done while you are taking this medicine. What side effects may I notice from receiving this medicine? Side effects that you should report to your doctor or health care professional as soon as possible: -allergic reactions like skin rash, itching, or hives -bone fracture -chest pain -signs and symptoms of a blood clot such as breathing problems; changes  in vision; chest pain; severe, sudden headache; pain, swelling, warmth in the leg; trouble speaking; sudden numbness or weakness of the face, arm or leg -vaginal bleeding Side effects that usually do not require medical attention (report to your doctor or health care professional if they continue or are bothersome): -bone, back, joint, or muscle pain -dizziness -fatigue -fluid retention -headache -hot flashes, night sweats -nausea -weight gain This list may not describe all possible side effects. Call your doctor for medical advice about side effects. You may report side effects to FDA at 1-800-FDA-1088. Where should I keep my medicine? Keep out of the reach of children. Store between 15 and 30 degrees C (59 and 86 degrees F). Throw away any unused medicine after the expiration date. NOTE: This sheet is a summary. It may not cover all possible information. If you have questions about this medicine, talk to your doctor, pharmacist, or health care provider.  2018 Elsevier/Gold Standard (2016-06-03 11:10:41)  _______________________________________________________________  Thank you for choosing [place] at [facility] to provide your oncology and hematology care.  To afford each patient quality time with our providers, please arrive at least 15 minutes before your scheduled appointment.  You need to re-schedule your appointment if you arrive 10 or more minutes late.  We strive to give you quality time with our providers, and arriving late affects you and other patients whose appointments are after yours.  Also, if you no show three or more times for appointments you may be dismissed from the clinic.  Again, thank you for choosing [facility] hope is that these requests will allow you access to exceptional care and in a timely manner. _______________________________________________________________  If you have questions after  your visit, please contact our office at [phone] between the hours of 8:30 a.m. and 5:00 p.m. Voicemails left after 4:30 p.m. will not be returned until the following business day. _______________________________________________________________  For prescription refill requests, have your pharmacy contact our office. _______________________________________________________________  Recommendations made by the consultant and any test results will be sent to your referring physician. _______________________________________________________________

## 2018-02-05 LAB — FOLLICLE STIMULATING HORMONE: FSH: 26.8 m[IU]/mL

## 2018-02-05 LAB — LUTEINIZING HORMONE: LH: 20.7 m[IU]/mL

## 2018-02-06 ENCOUNTER — Telehealth (HOSPITAL_COMMUNITY): Payer: Self-pay | Admitting: *Deleted

## 2018-02-06 NOTE — Telephone Encounter (Signed)
LMOMfor pt to call me back Monday.

## 2018-02-09 ENCOUNTER — Telehealth (HOSPITAL_COMMUNITY): Payer: Self-pay | Admitting: *Deleted

## 2018-02-09 NOTE — Telephone Encounter (Signed)
Pt aware that we will be sending in Femara to her pharmacy and to stop taking the tamoxifen. Pt verbalized understanding.

## 2018-02-10 ENCOUNTER — Other Ambulatory Visit (HOSPITAL_COMMUNITY): Payer: Self-pay

## 2018-02-10 MED ORDER — LETROZOLE 2.5 MG PO TABS: 2.5000 mg | ORAL_TABLET | Freq: Every day | ORAL | 4 refills | Status: DC

## 2018-02-10 NOTE — Progress Notes (Signed)
Diagnosis Malignant neoplasm of upper-inner quadrant of right breast in female, estrogen receptor positive ([place]) - Plan: DG Bone Density  Staging Cancer Staging Stage IB [id number]  Assessment and Plan: 1. 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  stage IB with micrometastatic disease to a sentinel LN. She has completed XRT. She is on tamoxifen which she started in 2017.   She denies any menstrual cycles for a year.   She had labs done on 02/04/2018 with a FEspinoof 27 placing her in a postmenopausal category.  She had previously had discussion with Dr. ZTalbert Cageabout switching to an aromatase inhibitor.  I have discussed with her options of Femara 2.5 mg p.o. daily with an initial goal of 5 years.  Side effects of the medication were reviewed with the patient.  Prescription will be sent to her pharmacy.  She will be set up for bone density evaluation.  All questions answered and she expressed understanding of the information presented.  Pt reportedly was due for mammogram November 2018.  Will obtain results from that imaging.  Pending those results she will likely need repeat evaluation in November 2019.  2.  Amenorrhea.  Patient reports she has not had a menstrual cycle for a year.    She had labs done on 02/04/2018 [facility] placing her in a postmenopausal category.  3.  Hypertension.  Blood pressure is 152/88.  Continue to follow-up with her PCP.  4.  Health maintenance.  She is set up for bone density evaluation.  Interval History:   52y.o. female with Right breast cancer, ER+ PR+ HER-2(-), stage IB with micrometastatic disease to a sentinel LN. She has completed XRT. She is on tamoxifen which she began in 2017.    Current Status:  Pt is seen today for follow-up.  Reports she has not had a menstrual cycle in a year.  She continues to have her mammograms performed off site.    [place]   03/[place]/2016  Mammogram    1.9 cm irregular spiculated mass in the upper inner quadrant of right breast.      04/19/2016 Pathology Results    [facility]ology-infiltrating ductal carcinoma of right breast.      05/03/2016 Pathology Results    GOrettapathology consult- Consult Slide , right breast biopsy - INVASIVE DUCTAL CARCINOMA. ER (95%, strong staining intensity) and PR (95%, strong staining intensity). Her2 is negative (1+). Ki-67 reveals a proliferation rate of 30% and p53 is negative.      05/09/2016 Procedure    Right axillary lymph node biopsy      05/09/2016 Pathology Results    Lymph node, needle/core biopsy, right axillary LYMPHOID TISSUE, NEGATIVE FOR CARCINOMA.      05/15/2016 Surgery    R breast seed guided lumpectomy, R axillary SN biopsy      05/15/2016 Pathology Results    invasive ductal carcinoma 2 cm, sentinel node micrometastatic carcinoma 0.2 mm, total 3 sentinel nodes 1/3 with micromets, ER 95%, PR 95%, Her 2 neu - Ki-67 30% pT1c, [id number]       Radiation Therapy     The patient saw No care team member to display for radiation treatment. This is the current list of radiation treatment: Radiation Treatments       Patient's record has no active or historical radiation treatments documented.             05/21/2016  Oncotype testing    Recurrence score results of 14, 10 year risk of distant recurrence with tamoxifen alone 9%        Problem List Patient Active Problem List   Diagnosis Date Noted  . Premenopausal patient [N95.9] 08/14/2016  . Encounter for monitoring tamoxifen therapy [Z51.81, D03.013] 08/14/2016  . Breast cancer of upper-inner quadrant of right female breast [facility]) [C50.211] 05/03/2016    Past Medical History Past Medical History:  Diagnosis Date  . Breast cancer of upper-outer quadrant of right female breast ([place]) 05/03/2016  . GERD (gastroesophageal reflux disease)    less since gallbladder removed  . Headache    while on BCP  . Hypertension      Past Surgical History Past Surgical History:  Procedure Laterality Date  . CHOLECYSTECTOMY  2011  . HERNIA REPAIR  2017   UHR  . RADIOACTIVE SEED GUIDED PARTIAL MASTECTOMY WITH AXILLARY SENTINEL LYMPH NODE BIOPSY Right 05/15/2016   Procedure: RADIOACTIVE SEED GUIDED RIGHT BREAST LUMPECTOMY WITH AXILLARY SENTINEL LYMPH NODE BIOPSY;  Surgeon: Rolm Bookbinder, MD;  Location: [place];  Service: General;  Laterality: Right;  . TUBAL LIGATION    . WISDOM TOOTH EXTRACTION      Family History Family History  Problem Relation Age of Onset  . Diabetes Mother   . Hypertension Mother   . Cirrhosis Mother        NAFLD  . Hypertension Father   . Cancer Father        colon     Social History  reports that she has quit smoking. She has never used smokeless tobacco. She reports that she drinks alcohol. She reports that she does not use drugs.  Medications  Current Outpatient Medications:  .  ALPRAZolam (XANAX) 0.25 MG tablet, Take 0.25 mg by mouth at bedtime as needed for anxiety., Disp: , Rfl:  .  Ca Phosphate-Cholecalciferol (CALCIUM/VITAMIN D3 GUMMIES PO), Take 800 Int'l Units by mouth daily. Calcium = 577m  Vit D = 800iu, Disp: , Rfl:  .  cetirizine (ZYRTEC) 10 MG tablet, Take 10 mg by mouth daily., Disp: , Rfl:  .  losartan (COZAAR) 50 MG tablet, Take 50 mg by mouth daily., Disp: , Rfl:  .  Potassium 95 MG TABS, Take 99 mg by mouth daily., Disp: , Rfl:  .  Probiotic Product (PROBIOTIC ADVANCED PO), Take by mouth., Disp: , Rfl:  .  tamoxifen (NOLVADEX) 20 MG tablet, Take 1 tablet (20 mg total) by mouth daily., Disp: 30 tablet, Rfl: 3  Allergies Shrimp [shellfish allergy]  Review of Systems Review of Systems - Oncology ROS as per HPI otherwise 12 point ROS is negative.   Physical Exam  Vitals Wt Readings from Last 3 Encounters:  02/04/18 204 lb 3.2 oz (92.6 kg)  08/06/17 206 lb 8 oz (93.7 kg)  01/06/17 208 lb 3.2 oz (94.4 kg)   Temp Readings from Last  3 Encounters:  02/04/18 98.6 F (37 C) (Oral)  01/06/17 97.7 F (36.5 C) (Oral)  10/01/16 98.2 F (36.8 C) (Oral)   BP Readings from Last 3 Encounters:  02/04/18 (!) 152/88  08/06/17 (!) 142/80  01/06/17 133/86   Pulse Readings from Last 3 Encounters:  02/04/18 91  08/06/17 84  01/06/17 86   Constitutional: Well-developed, well-nourished, and in no distress.   HENT: Head: Normocephalic and atraumatic.  Mouth/Throat: No oropharyngeal exudate. Mucosa moist. Eyes: Pupils are equal, round, and reactive to light. Conjunctivae are normal. No scleral icterus.  Neck: Normal  range of motion. Neck supple. No JVD present.  Cardiovascular: Normal rate, regular rhythm and normal heart sounds.  Exam reveals no gallop and no friction rub.   No murmur heard. Pulmonary/Chest: Effort normal and breath sounds normal. No respiratory distress. No wheezes.No rales.  Abdominal: Soft. Bowel sounds are normal. No distension. There is no tenderness. There is no guarding.  Musculoskeletal: No edema or tenderness.  Lymphadenopathy: No cervical, axillary or supraclavicular adenopathy.  Neurological: Alert and oriented to person, place, and time. No cranial nerve deficit.  Skin: Skin is warm and dry. No rash noted. No erythema. No pallor.  Psychiatric: Affect and judgment normal.  Bilateral breast exam: Chaperone present.  Right lumpectomy scar.  No palpable abnormalities noted bilaterally.  Labs Appointment on 02/04/2018  Component Date Value Ref Range Status  . WBC 02/04/2018 5.7  4.0 - 10.5 K/uL Final  . RBC 02/04/2018 4.15  3.87 - 5.11 MIL/uL Final  . Hemoglobin 02/04/2018 12.9  12.0 - 15.0 g/dL Final  . HCT 02/04/2018 38.6  36.0 - 46.0 % Final  . MCV 02/04/2018 93.0  78.0 - 100.0 fL Final  . MCH 02/04/2018 31.1  26.0 - 34.0 pg Final  . MCHC 02/04/2018 33.4  30.0 - 36.0 g/dL Final  . RDW 02/04/2018 12.3  11.5 - 15.5 % Final  . Platelets 02/04/2018 190  150 - 400 K/uL Final  . Neutrophils Relative  % 02/04/2018 57  % Final  . Neutro Abs 02/04/2018 3.3  1.7 - 7.7 K/uL Final  . Lymphocytes Relative 02/04/2018 31  % Final  . Lymphs Abs 02/04/2018 1.8  0.7 - 4.0 K/uL Final  . Monocytes Relative 02/04/2018 6  % Final  . Monocytes Absolute 02/04/2018 0.3  0.1 - 1.0 K/uL Final  . Eosinophils Relative 02/04/2018 5  % Final  . Eosinophils Absolute 02/04/2018 0.3  0.0 - 0.7 K/uL Final  . Basophils Relative 02/04/2018 1  % Final  . Basophils Absolute 02/04/2018 0.0  0.0 - 0.1 K/uL Final   Performed at [facility], [address], [place], [place] [address]  . Sodium 02/04/2018 138  135 - 145 mmol/L Final  . Potassium 02/04/2018 4.0  3.5 - 5.1 mmol/L Final  . Chloride 02/04/2018 102  101 - 111 mmol/L Final  . CO2 02/04/2018 26  22 - 32 mmol/L Final  . Glucose, Bld 02/04/2018 99  65 - 99 mg/dL Final  . BUN 02/04/2018 14  6 - 20 mg/dL Final  . Creatinine, Ser 02/04/2018 0.95  0.44 - 1.00 mg/dL Final  . Calcium 02/04/2018 9.7  8.9 - 10.3 mg/dL Final  . Total Protein 02/04/2018 7.4  6.5 - 8.1 g/dL Final  . Albumin 02/04/2018 4.0  3.5 - 5.0 g/dL Final  . AST 02/04/2018 40  15 - 41 U/L Final  . ALT 02/04/2018 64* 14 - 54 U/L Final  . Alkaline Phosphatase 02/04/2018 59  38 - 126 U/L Final  . Total Bilirubin 02/04/2018 1.0  0.3 - 1.2 mg/dL Final  . GFR calc non Af Amer 02/04/2018 >60  >60 mL/min Final  . GFR calc Af Amer 02/04/2018 >60  >60 mL/min Final   Comment: (NOTE) The eGFR has been calculated using the CKD EPI equation. This calculation has not been validated in all clinical situations. eGFR's persistently <60 mL/min signify possible Chronic Kidney Disease.   Georgiann Hahn gap 02/04/2018 10  5 - 15 Final   Performed at [facility], [address], [place],  [address]  .  LH 02/04/2018 20.7  mIU/mL Final   Comment: (NOTE)                    Adult Female:                      Follicular phase      2.4 -  12.6                      Ovulation phase      14.0 -  95.6                       Luteal phase          1.0 -  11.4                      Postmenopausal        7.7 -  58.5 Performed At: [facility] [place], [place] [id number] Rush Farmer MD [id number] Performed at [facility], [address], [place], [place] [address]   . [facility] 02/04/2018 26.8  mIU/mL Final   Comment: (NOTE)                    Adult Female:                      Follicular phase      3.5 -  12.5                      Ovulation phase       4.7 -  21.5                      Luteal phase          1.7 -   7.7                      Postmenopausal       25.8 - 134.8 Performed At: [name] [place], [place] [id number] Rush Farmer MD [id number] Performed at [facility], [address] [place], [place] [address]      Pathology Orders Placed This Encounter  Procedures  . DG Bone Density    Standing Status:   Future    Standing Expiration Date:   02/04/2019    Order Specific Question:   Reason for Exam (SYMPTOM  OR DIAGNOSIS REQUIRED)    Answer:   breast cancer for AI therapy    Order Specific Question:   Is the patient pregnant?    Answer:   No    Order Specific Question:   Preferred imaging location?    Answer:   [facility]       Zoila Shutter MD

## 2018-02-12 ENCOUNTER — Ambulatory Visit (HOSPITAL_COMMUNITY)
Admission: RE | Admit: 2018-02-12 | Discharge: 2018-02-12 | Disposition: A | Discharge status: Home / Self Care | Payer: BLUE CROSS/BLUE SHIELD | Source: Ambulatory Visit | Attending: Internal Medicine | Admitting: Internal Medicine

## 2018-02-12 ENCOUNTER — Encounter (HOSPITAL_COMMUNITY): Payer: Self-pay | Admitting: Radiology

## 2018-02-12 DIAGNOSIS — M8589 Other specified disorders of bone density and structure, multiple sites: Secondary | ICD-10-CM | POA: Diagnosis not present

## 2018-02-12 DIAGNOSIS — Z170 Estrogen receptor positive status [ER+]: Secondary | ICD-10-CM | POA: Insufficient documentation

## 2018-02-12 DIAGNOSIS — C50211 Malignant neoplasm of upper-inner quadrant of right female breast: Secondary | ICD-10-CM | POA: Diagnosis present

## 2018-04-03 ENCOUNTER — Ambulatory Visit (HOSPITAL_COMMUNITY): Payer: BLUE CROSS/BLUE SHIELD | Admitting: Adult Health

## 2018-10-13 ENCOUNTER — Other Ambulatory Visit: Payer: Self-pay

## 2018-10-13 ENCOUNTER — Inpatient Hospital Stay (HOSPITAL_COMMUNITY): Payer: BLUE CROSS/BLUE SHIELD | Attending: Internal Medicine | Admitting: Internal Medicine

## 2018-10-13 ENCOUNTER — Ambulatory Visit (HOSPITAL_COMMUNITY): Payer: BLUE CROSS/BLUE SHIELD | Admitting: Hematology

## 2018-10-13 ENCOUNTER — Encounter (HOSPITAL_COMMUNITY): Payer: Self-pay | Admitting: Internal Medicine

## 2018-10-13 ENCOUNTER — Inpatient Hospital Stay (HOSPITAL_COMMUNITY): Payer: BLUE CROSS/BLUE SHIELD | Admitting: Hematology

## 2018-10-13 VITALS — BP 140/77 | HR 77 | Temp 98.7°F | Resp 14 | Wt 206.0 lb

## 2018-10-13 DIAGNOSIS — N83202 Unspecified ovarian cyst, left side: Secondary | ICD-10-CM

## 2018-10-13 DIAGNOSIS — Z87891 Personal history of nicotine dependence: Secondary | ICD-10-CM

## 2018-10-13 DIAGNOSIS — M8580 Other specified disorders of bone density and structure, unspecified site: Secondary | ICD-10-CM

## 2018-10-13 DIAGNOSIS — Z170 Estrogen receptor positive status [ER+]: Secondary | ICD-10-CM

## 2018-10-13 DIAGNOSIS — Z79811 Long term (current) use of aromatase inhibitors: Secondary | ICD-10-CM

## 2018-10-13 DIAGNOSIS — N912 Amenorrhea, unspecified: Secondary | ICD-10-CM | POA: Diagnosis not present

## 2018-10-13 DIAGNOSIS — I10 Essential (primary) hypertension: Secondary | ICD-10-CM | POA: Diagnosis not present

## 2018-10-13 DIAGNOSIS — C50911 Malignant neoplasm of unspecified site of right female breast: Secondary | ICD-10-CM

## 2018-10-13 DIAGNOSIS — C50211 Malignant neoplasm of upper-inner quadrant of right female breast: Secondary | ICD-10-CM | POA: Diagnosis not present

## 2018-10-13 MED ORDER — LETROZOLE 2.5 MG PO TABS: 2.5000 mg | ORAL_TABLET | Freq: Every day | ORAL | 4 refills | Status: DC

## 2018-10-13 NOTE — Progress Notes (Signed)
Diagnosis Malignant neoplasm of upper-inner quadrant of right breast in female, estrogen receptor positive ([place])  Er+ PR+ carcinoma of breast, right ([place]) - Plan: MM Digital Diagnostic Bilat  Staging Cancer Staging No matching staging information was found for the patient.  Assessment and Plan:  1. 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  stage IB with micrometastatic disease to a sentinel LN. She has completed XRT. She is on tamoxifen which she started in 2017.   She denies any menstrual cycles for a year.   She had labs done on 02/04/2018 with a [place] of 27 placing her in a postmenopausal category.  She had previously had discussion with Dr. Talbert Cage about switching to an aromatase inhibitor.  I  discussed with her options of Femara 2.5 mg p.o. daily with an initial goal of 5 years.  Pt began Femara in 02/2018.  Bone density done 02/12/2018 showed osteopenia.  Pt advised to continue calcium and vitamin D.  Will obtain mammogram reports for review.  Pt is set up for repeat imaging in 03/2019.  Rx for Femara # 90 sent to pharmacy.   2.  Amenorrhea.  Patient reported she has not had a menstrual cycle for a year.    She had labs done on 02/04/2018 with an [facility] of 27 placing her in a postmenopausal category.  She reports she had a bleeding episode in 05/2018 after being on Femara.  She was seen by GYN and had no recurrences since that time.  She is advised to notify the office of GYN if symptoms recur.    3  Ovarian Cysts.  She reports GYN evaluation showed 2 cysts on left ovary that were reportedly biopsied and and were negative.  Pt should follow-up with GYN as directed.    4.  Hypertension.  BP is 140/77.  Follow-up with PCP.    5.  Osteopenia.  Pt had Dexa done 02/12/2018. Pt advised to continue calcium and vitamin D.    25 minutes spent with more than 50% spent in counseling and coordination of care.    Interval History:   Historical data obtained from note dated 02/04/2018.  52  y.o. female with Right breast cancer, ER+ PR+ HER-2(-), stage IB with micrometastatic disease to a sentinel LN. She has completed XRT. She was on tamoxifen which she began in 2017.  Pt was switched to Femara in 02/2018.    Current Status:  Pt is seen today for follow-up.  She reports she had some vaginal bleeding in 05/2018 but has not had any bleeding since that time.  She had mammogram done in 03/2018 at off site facility.      Breast cancer of upper-inner quadrant of right female breast ([place])   03/28/2016 Mammogram    1.9 cm irregular spiculated mass in the upper inner quadrant of right breast.    04/19/2016 Pathology Results    [place] pathology-infiltrating ductal carcinoma of right breast.    05/03/2016 Pathology Results    [place] pathology consult- Consult Slide , right breast biopsy - INVASIVE DUCTAL CARCINOMA. ER (95%, strong staining intensity) and PR (95%, strong staining intensity). Her2 is negative (1+). Ki-67 reveals a proliferation rate of 30% and p53 is negative.    05/09/2016 Procedure    Right axillary lymph node biopsy    05/09/2016 Pathology Results    Lymph node, needle/core biopsy, right axillary LYMPHOID TISSUE, NEGATIVE FOR CARCINOMA.    05/15/2016 Surgery    R breast seed guided lumpectomy,  R axillary SN biopsy    05/15/2016 Pathology Results    invasive ductal carcinoma 2 cm, sentinel node micrometastatic carcinoma 0.2 mm, total 3 sentinel nodes 1/3 with micromets, ER 95%, PR 95%, Her 2 neu - Ki-67 30% pT1c, pN1MipNX     Radiation Therapy     The patient saw No care team member to display for radiation treatment. This is the current list of radiation treatment: Radiation Treatments       Patient's record has no active or historical radiation treatments documented.           05/21/2016 Oncotype testing    Recurrence score results of 14, 10 year risk of distant recurrence with tamoxifen alone 9%      Problem List Patient Active Problem List   Diagnosis Date Noted   . Premenopausal patient [N95.9] 08/14/2016  . Encounter for monitoring tamoxifen therapy [Z51.81, J03.009] 08/14/2016  . Breast cancer of upper-inner quadrant of right female breast [facility]) [C50.211] 05/03/2016    Past Medical History Past Medical History:  Diagnosis Date  . Breast cancer of upper-outer quadrant of right female breast ([place]) 05/03/2016  . GERD (gastroesophageal reflux disease)    less since gallbladder removed  . Headache    while on BCP  . Hypertension     Past Surgical History Past Surgical History:  Procedure Laterality Date  . CHOLECYSTECTOMY  2011  . HERNIA REPAIR  2017   UHR  . RADIOACTIVE SEED GUIDED PARTIAL MASTECTOMY WITH AXILLARY SENTINEL LYMPH NODE BIOPSY Right 05/15/2016   Procedure: RADIOACTIVE SEED GUIDED RIGHT BREAST LUMPECTOMY WITH AXILLARY SENTINEL LYMPH NODE BIOPSY;  Surgeon: Rolm Bookbinder, MD;  Location: [place];  Service: General;  Laterality: Right;  . TUBAL LIGATION    . WISDOM TOOTH EXTRACTION      Family History Family History  Problem Relation Age of Onset  . Diabetes Mother   . Hypertension Mother   . Cirrhosis Mother        NAFLD  . Hypertension Father   . Cancer Father        colon     Social History  reports that she has quit smoking. She has never used smokeless tobacco. She reports that she drinks alcohol. She reports that she does not use drugs.  Medications  Current Outpatient Medications:  .  Black Pepper-Turmeric (TURMERIC CURCUMIN) 03-999 MG CAPS, Take 1 capsule by mouth daily., Disp: , Rfl:  .  omeprazole (PRILOSEC OTC) 20 MG tablet, Take 20 mg by mouth as needed., Disp: , Rfl:  .  ALPRAZolam (XANAX) 0.25 MG tablet, Take 0.25 mg by mouth at bedtime as needed for anxiety., Disp: , Rfl:  .  cetirizine (ZYRTEC) 10 MG tablet, Take 10 mg by mouth daily., Disp: , Rfl:  .  hydrochlorothiazide (HYDRODIURIL) 25 MG tablet, Take 25 mg by mouth daily. , Disp: , Rfl:  .  letrozole (FEMARA) 2.5 MG tablet,  Take 1 tablet (2.5 mg total) by mouth daily., Disp: 90 tablet, Rfl: 4 .  losartan (COZAAR) 50 MG tablet, Take 50 mg by mouth daily., Disp: , Rfl:  .  Probiotic Product (PROBIOTIC ADVANCED PO), Take by mouth., Disp: , Rfl:   Allergies Shrimp [shellfish allergy]  Review of Systems Review of Systems - Oncology ROS negative   Physical Exam  Vitals Wt Readings from Last 3 Encounters:  10/13/18 206 lb (93.4 kg)  02/04/18 204 lb 3.2 oz (92.6 kg)  08/06/17 206 lb 8 oz (93.7 kg)   Temp  Readings from Last 3 Encounters:  10/13/18 98.7 F (37.1 C) (Oral)  02/04/18 98.6 F (37 C) (Oral)  01/06/17 97.7 F (36.5 C) (Oral)   BP Readings from Last 3 Encounters:  10/13/18 140/77  02/04/18 (!) 152/88  08/06/17 (!) 142/80   Pulse Readings from Last 3 Encounters:  10/13/18 77  02/04/18 91  08/06/17 84   Constitutional: Well-developed, well-nourished, and in no distress.   HENT: Head: Normocephalic and atraumatic.  Mouth/Throat: No oropharyngeal exudate. Mucosa moist. Eyes: Pupils are equal, round, and reactive to light. Conjunctivae are normal. No scleral icterus.  Neck: Normal range of motion. Neck supple. No JVD present.  Cardiovascular: Normal rate, regular rhythm and normal heart sounds.  Exam reveals no gallop and no friction rub.   No murmur heard. Pulmonary/Chest: Effort normal and breath sounds normal. No respiratory distress. No wheezes.No rales.  Abdominal: Soft. Bowel sounds are normal. No distension. There is no tenderness. There is no guarding.  Musculoskeletal: No edema or tenderness.  Lymphadenopathy: No cervical, axillary or supraclavicular adenopathy.  Neurological: Alert and oriented to person, place, and time. No cranial nerve deficit.  Skin: Skin is warm and dry. No rash noted. No erythema. No pallor.  Psychiatric: Affect and judgment normal.  Breast exam:  Chaperone present.  Lumpectomy changes in right breast.  No dominant masses palpable bilaterally.     Labs No visits with results within 3 Day(s) from this visit.  Latest known visit with results is:  Appointment on 02/04/2018  Component Date Value Ref Range Status  . WBC 02/04/2018 5.7  4.0 - 10.5 K/uL Final  . RBC 02/04/2018 4.15  3.87 - 5.11 MIL/uL Final  . Hemoglobin 02/04/2018 12.9  12.0 - 15.0 g/dL Final  . HCT 02/04/2018 38.6  36.0 - 46.0 % Final  . MCV 02/04/2018 93.0  78.0 - 100.0 fL Final  . MCH 02/04/2018 31.1  26.0 - 34.0 pg Final  . MCHC 02/04/2018 33.4  30.0 - 36.0 g/dL Final  . RDW 02/04/2018 12.3  11.5 - 15.5 % Final  . Platelets 02/04/2018 190  150 - 400 K/uL Final  . Neutrophils Relative % 02/04/2018 57  % Final  . Neutro Abs 02/04/2018 3.3  1.7 - 7.7 K/uL Final  . Lymphocytes Relative 02/04/2018 31  % Final  . Lymphs Abs 02/04/2018 1.8  0.7 - 4.0 K/uL Final  . Monocytes Relative 02/04/2018 6  % Final  . Monocytes Absolute 02/04/2018 0.3  0.1 - 1.0 K/uL Final  . Eosinophils Relative 02/04/2018 5  % Final  . Eosinophils Absolute 02/04/2018 0.3  0.0 - 0.7 K/uL Final  . Basophils Relative 02/04/2018 1  % Final  . Basophils Absolute 02/04/2018 0.0  0.0 - 0.1 K/uL Final   Performed at [facility], [address], [place], [place] [address]  . Sodium 02/04/2018 138  135 - 145 mmol/L Final  . Potassium 02/04/2018 4.0  3.5 - 5.1 mmol/L Final  . Chloride 02/04/2018 102  101 - 111 mmol/L Final  . CO2 02/04/2018 26  22 - 32 mmol/L Final  . Glucose, Bld 02/04/2018 99  65 - 99 mg/dL Final  . BUN 02/04/2018 14  6 - 20 mg/dL Final  . Creatinine, Ser 02/04/2018 0.95  0.44 - 1.00 mg/dL Final  . Calcium 02/04/2018 9.7  8.9 - 10.3 mg/dL Final  . Total Protein 02/04/2018 7.4  6.5 - 8.1 g/dL Final  . Albumin 02/04/2018 4.0  3.5 - 5.0 g/dL Final  . AST 02/04/2018 40  15 - 41 U/L Final  . ALT 02/04/2018 64* 14 - 54 U/L Final  . Alkaline Phosphatase 02/04/2018 59  38 - 126 U/L Final  . Total Bilirubin 02/04/2018 1.0  0.3 - 1.2 mg/dL Final  . GFR calc non Af Amer 02/04/2018 >60   >60 mL/min Final  . GFR calc Af Amer 02/04/2018 >60  >60 mL/min Final   Comment: (NOTE) The eGFR has been calculated using the CKD EPI equation. This calculation has not been validated in all clinical situations. eGFR's persistently <60 mL/min signify possible Chronic Kidney Disease.   Georgiann Hahn gap 02/04/2018 10  5 - 15 Final   Performed at [facility], [address] [place], [place] [address]  . LH 02/04/2018 20.7  mIU/mL Final   Comment: (NOTE)                    Adult Female:                      Follicular phase      2.4 -  12.6                      Ovulation phase      14.0 -  95.6                      Luteal phase          1.0 -  11.4                      Postmenopausal        7.7 -  58.5 Performed At: [facility] [place], [place] [id number] Rush Farmer MD [id number] Performed at [facility], [address] [place], [place] [address]   . [facility] 02/04/2018 26.8  mIU/mL Final   Comment: (NOTE)                    Adult Female:                      Follicular phase      3.5 -  12.5                      Ovulation phase       4.7 -  21.5                      Luteal phase          1.7 -   7.7                      Postmenopausal       25.8 - 134.8 Performed At: [facility] [place], [place] [id number] Rush Farmer MD [id number] Performed at [facility], [address] [place], [place] [address]      Pathology Orders Placed This Encounter  Procedures  . MM Digital Diagnostic Bilat    Standing Status:   Future    Standing Expiration Date:   10/13/2019    Order Specific Question:   Reason for Exam (SYMPTOM  OR DIAGNOSIS REQUIRED)    Answer:   right breast cancer    Order Specific Question:   Is the patient pregnant?    Answer:   No    Order Specific Question:   Preferred imaging location?  Answer:   External       Zoila Shutter MD

## 2018-10-13 NOTE — Patient Instructions (Signed)
[place] at [facility]  Discharge Instructions:  You saw Dr. Walden Field today. _______________________________________________________________  Thank you for choosing [place] at [facility] to provide your oncology and hematology care.  To afford each patient quality time with our providers, please arrive at least 15 minutes before your scheduled appointment.  You need to re-schedule your appointment if you arrive 10 or more minutes late.  We strive to give you quality time with our providers, and arriving late affects you and other patients whose appointments are after yours.  Also, if you no show three or more times for appointments you may be dismissed from the clinic.  Again, thank you for choosing [place] at [place] hope is that these requests will allow you access to exceptional care and in a timely manner. _______________________________________________________________  If you have questions after your visit, please contact our office at [phone] between the hours of 8:30 a.m. and 5:00 p.m. Voicemails left after 4:30 p.m. will not be returned until the following business day. _______________________________________________________________  For prescription refill requests, have your pharmacy contact our office. _______________________________________________________________  Recommendations made by the consultant and any test results will be sent to your referring physician. _______________________________________________________________

## 2018-10-16 ENCOUNTER — Ambulatory Visit (HOSPITAL_COMMUNITY): Payer: BLUE CROSS/BLUE SHIELD | Admitting: Hematology

## 2019-04-09 ENCOUNTER — Ambulatory Visit (HOSPITAL_COMMUNITY): Payer: BLUE CROSS/BLUE SHIELD | Admitting: Nurse Practitioner

## 2019-05-12 ENCOUNTER — Other Ambulatory Visit: Payer: Self-pay

## 2019-05-12 ENCOUNTER — Inpatient Hospital Stay (HOSPITAL_COMMUNITY): Payer: BC Managed Care – PPO | Attending: Hematology | Admitting: Hematology

## 2019-05-12 DIAGNOSIS — Z170 Estrogen receptor positive status [ER+]: Secondary | ICD-10-CM | POA: Insufficient documentation

## 2019-05-12 DIAGNOSIS — Z79811 Long term (current) use of aromatase inhibitors: Secondary | ICD-10-CM | POA: Diagnosis not present

## 2019-05-12 DIAGNOSIS — Z800 Family history of malignant neoplasm of digestive organs: Secondary | ICD-10-CM | POA: Diagnosis not present

## 2019-05-12 DIAGNOSIS — C50211 Malignant neoplasm of upper-inner quadrant of right female breast: Secondary | ICD-10-CM | POA: Insufficient documentation

## 2019-05-12 DIAGNOSIS — Z87891 Personal history of nicotine dependence: Secondary | ICD-10-CM | POA: Insufficient documentation

## 2019-05-12 DIAGNOSIS — Z923 Personal history of irradiation: Secondary | ICD-10-CM | POA: Insufficient documentation

## 2019-05-12 NOTE — Assessment & Plan Note (Signed)
1. 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  - 03/2016: Mammogram: 1.9 cm irregular spiculated mass in the upper inner quadrant of right breast. -  Pathology: right breast biopsy - INVASIVE DUCTAL CARCINOMA. ER (95%, strong staining intensity) and PR (95%, strong staining intensity). Her2 is negative (1+). Ki-67 reveals a proliferation rate of 30% and p53 is negative - R axillary SN biopsy: invasive ductal carcinoma 2 cm, sentinel node micrometastatic carcinoma 0.2 mm, total 3 sentinel nodes 1/3 with micromets, ER 95%, PR 95%, Her 2 neu - Ki-67 30% pT1c, pN1MipNX  - S/P adjuvant XRT.  - 05/2016: Oncotype: Recurrence score of 14, 10-year risk of distant recurrence with tamoxifen alone 9% - Started on  tamoxifen in 2017. - Changed to Letrozole in 2019; s/p menopause  -Most recent mammogram report was negative for any disease recurrence.  Mammogram was completed at patient's gynecologist's office in [place]. -Physical exam today was negative for any evidence of disease. -Recommend patient continue on letrozole daily.  Recommend repeat diagnostic mammogram in 1 year.  2.  Bone health -DEXA scan in April 2019 revealed a T score of -1.5 which is consistent with osteopenia. -Recommend patient continue with vitamin D and calcium supplements. 

## 2019-05-12 NOTE — Progress Notes (Signed)
[place], [place] [address]   CLINIC:  Medical Oncology/Hematology  PCP:  Patient, No Pcp Per No address on file None   REASON FOR VISIT:  Follow-up for Breast Cancer  CURRENT THERAPY: Clinical surveillance  BRIEF ONCOLOGIC HISTORY:  Oncology History  Breast cancer of upper-inner quadrant of right female breast ([place])  03/28/2016 Mammogram   1.9 cm irregular spiculated mass in the upper inner quadrant of right breast.   04/19/2016 Pathology Results   [facility] pathology-infiltrating ductal carcinoma of right breast.   05/03/2016 Pathology Results   [facility] pathology consult- Consult [place] , right breast biopsy - INVASIVE DUCTAL CARCINOMA. ER (95%, strong staining intensity) and PR (95%, strong staining intensity). Her2 is negative (1+). Ki-67 reveals a proliferation rate of 30% and p53 is negative.   05/09/2016 Procedure   Right axillary lymph node biopsy   05/09/2016 Pathology Results   Lymph node, needle/core biopsy, right axillary LYMPHOID TISSUE, NEGATIVE FOR CARCINOMA.   05/15/2016 Surgery   R breast seed guided lumpectomy, R axillary SN biopsy   05/15/2016 Pathology Results   invasive ductal carcinoma 2 cm, sentinel node micrometastatic carcinoma 0.2 mm, total 3 sentinel nodes 1/3 with micromets, ER 95%, PR 95%, Her 2 neu - Ki-67 30% pT1c, pN1MipNX    Radiation Therapy    The patient saw No care team member to display for radiation treatment. This is the current list of radiation treatment: Radiation Treatments       Patient's record has no active or historical radiation treatments documented.          05/21/2016 Oncotype testing   Recurrence score results of 14, 10 year risk of distant recurrence with tamoxifen alone 9%      CANCER STAGING: Cancer Staging No matching staging information was found for the patient.   INTERVAL HISTORY:  Ms. Agredano 53 y.o. female presents today for follow-up.  Reports overall doing well.  She  denies any significant fatigue.  She denies any changes in her breasts.  No new masses, lumps, or nipple inversion or nipple discharge.  She states she follows with her GYN annually.  Her GYN orders her mammograms and performs Pap smears and does blood work.   REVIEW OF SYSTEMS:  Review of Systems  All other systems reviewed and are negative.    PAST MEDICAL/SURGICAL HISTORY:  Past Medical History:  Diagnosis Date  . Breast cancer of upper-outer quadrant of right female breast ([place]) 05/03/2016  . GERD (gastroesophageal reflux disease)    less since gallbladder removed  . Headache    while on BCP  . Hypertension    Past Surgical History:  Procedure Laterality Date  . CHOLECYSTECTOMY  2011  . HERNIA REPAIR  2017   UHR  . RADIOACTIVE SEED GUIDED PARTIAL MASTECTOMY WITH AXILLARY SENTINEL LYMPH NODE BIOPSY Right 05/15/2016   Procedure: RADIOACTIVE SEED GUIDED RIGHT BREAST LUMPECTOMY WITH AXILLARY SENTINEL LYMPH NODE BIOPSY;  Surgeon: Rolm Bookbinder, MD;  Location: [facility];  Service: General;  Laterality: Right;  . TUBAL LIGATION    . WISDOM TOOTH EXTRACTION       SOCIAL HISTORY:  Social History   Socioeconomic History  . Marital status: Married    Spouse name: Not on file  . Number of children: Not on file  . Years of education: Not on file  . Highest education level: Not on file  Occupational History  . Not on file  Social Needs  . Emergency planning/management officer  strain: Not on file  . Food insecurity    Worry: Not on file    Inability: Not on file  . Transportation needs    Medical: Not on file    Non-medical: Not on file  Tobacco Use  . Smoking status: Former Research scientist (life sciences)  . Smokeless tobacco: Never Used  Substance and Sexual Activity  . Alcohol use: Yes    Comment: social  . Drug use: No  . Sexual activity: Yes    Birth control/protection: Surgical    Comment: TL  Lifestyle  . Physical activity    Days per week: Not on file    Minutes per session: Not on  file  . Stress: Not on file  Relationships  . Social Herbalist on phone: Not on file    Gets together: Not on file    Attends religious service: Not on file    Active member of club or organization: Not on file    Attends meetings of clubs or organizations: Not on file    Relationship status: Not on file  . Intimate partner violence    Fear of current or ex partner: Not on file    Emotionally abused: Not on file    Physically abused: Not on file    Forced sexual activity: Not on file  Other Topics Concern  . Not on file  Social History Narrative  . Not on file    FAMILY HISTORY:  Family History  Problem Relation Age of Onset  . Diabetes Mother   . Hypertension Mother   . Cirrhosis Mother        NAFLD  . Hypertension Father   . Cancer Father        colon    CURRENT MEDICATIONS:  Outpatient Encounter Medications as of 05/12/2019  Medication Sig  . ALPRAZolam (XANAX) 0.25 MG tablet Take 0.25 mg by mouth at bedtime as needed for anxiety.  Renard Hamper Pepper-Turmeric (TURMERIC CURCUMIN) 03-999 MG CAPS Take 1 capsule by mouth daily.  . Calcium Carb-Cholecalciferol (CALCIUM-VITAMIN D3) 600-400 MG-UNIT TABS Take 600 mg by mouth 2 (two) times daily.  . cetirizine (ZYRTEC) 10 MG tablet Take 10 mg by mouth daily.  . hydrochlorothiazide (HYDRODIURIL) 25 MG tablet Take 25 mg by mouth daily.   Marland Kitchen letrozole (FEMARA) 2.5 MG tablet Take 1 tablet (2.5 mg total) by mouth daily.  Marland Kitchen losartan (COZAAR) 50 MG tablet Take 50 mg by mouth daily.  . Magnesium 250 MG TABS Take 250 mg by mouth daily.  . Omega-3 Fatty Acids (FISH OIL) 1000 MG CAPS Take 1,000 mg by mouth daily.  Marland Kitchen omeprazole (PRILOSEC OTC) 20 MG tablet Take 20 mg by mouth as needed.  . Probiotic Product (PROBIOTIC ADVANCED PO) Take by mouth.   No facility-administered encounter medications on file as of 05/12/2019.     ALLERGIES:  Allergies  Allergen Reactions  . Shrimp [Shellfish Allergy] Hives, Itching and Rash      PHYSICAL EXAM:  ECOG Performance status: 1  Vitals:   05/12/19 1305  BP: 126/81  Pulse: 89  Resp: 18  Temp: 97.7 F (36.5 C)  SpO2: 98%   Filed Weights   05/12/19 1305  Weight: 188 lb (85.3 kg)    Physical Exam Vitals signs reviewed.  Constitutional:      Appearance: Normal appearance. She is obese.  HENT:     Head: Normocephalic.     Nose: Nose normal.     Mouth/Throat:     Mouth:  Mucous membranes are moist.     Pharynx: Oropharynx is clear.  Eyes:     Extraocular Movements: Extraocular movements intact.     Conjunctiva/sclera: Conjunctivae normal.  Neck:     Musculoskeletal: Normal range of motion.  Cardiovascular:     Rate and Rhythm: Normal rate and regular rhythm.     Pulses: Normal pulses.     Heart sounds: Normal heart sounds.  Pulmonary:     Effort: Pulmonary effort is normal.     Breath sounds: Normal breath sounds.  Abdominal:     General: Bowel sounds are normal.     Palpations: Abdomen is soft.  Musculoskeletal: Normal range of motion.  Skin:    General: Skin is warm and dry.  Neurological:     General: No focal deficit present.     Mental Status: She is alert and oriented to person, place, and time.  Psychiatric:        Mood and Affect: Mood normal.        Behavior: Behavior normal.        Thought Content: Thought content normal.        Judgment: Judgment normal.      LABORATORY DATA:  I have reviewed the labs as listed.  CBC    Component Value Date/Time   WBC 5.7 02/04/2018 1405   RBC 4.15 02/04/2018 1405   HGB 12.9 02/04/2018 1405   HCT 38.6 02/04/2018 1405   PLT 190 02/04/2018 1405   MCV 93.0 02/04/2018 1405   MCH 31.1 02/04/2018 1405   MCHC 33.4 02/04/2018 1405   RDW 12.3 02/04/2018 1405   LYMPHSABS 1.8 02/04/2018 1405   MONOABS 0.3 02/04/2018 1405   EOSABS 0.3 02/04/2018 1405   BASOSABS 0.0 02/04/2018 1405   CMP Latest Ref Rng & Units 02/04/2018 08/06/2017 01/06/2017  Glucose 65 - 99 mg/dL 99 123(H) 128(H)  BUN 6 -  20 mg/dL _0 Creatinine 0.44 - 1.00 mg/dL 0.95 1.01(H) 1.03(H)  Sodium 135 - 145 mmol/L 138 137 138  Potassium 3.5 - 5.1 mmol/L 4.0 3.7 3.5  Chloride 101 - 111 mmol/L 102 101 101  CO2 22 - 32 mmol/L _1 Calcium 8.9 - 10.3 mg/dL 9.7 9.3 9.2  Total Protein 6.5 - 8.1 g/dL 7.4 7.4 6.9  Total Bilirubin 0.3 - 1.2 mg/dL 1.0 0.8 0.8  Alkaline Phos 38 - 126 U/L 59 59 58  AST 15 - 41 U/L 40 57(H) 31  ALT 14 - 54 U/L 64(H) 81(H) 50        ASSESSMENT & PLAN:   Breast cancer of upper-inner quadrant of right female breast ([facility]) 1. 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  - 03/2016: Mammogram: 1.9 cm irregular spiculated mass in the upper inner quadrant of right breast. -  Pathology: right breast biopsy - INVASIVE DUCTAL CARCINOMA. ER (95%, strong staining intensity) and PR (95%, strong staining intensity). Her2 is negative (1+). Ki-67 reveals a proliferation rate of 30% and p53 is negative - R axillary SN biopsy: invasive ductal carcinoma 2 cm, sentinel node micrometastatic carcinoma 0.2 mm, total 3 sentinel nodes 1/3 with micromets, ER 95%, PR 95%, Her 2 neu - Ki-67 30% pT1c, pN1MipNX  - S/P adjuvant XRT.  - 05/2016: Oncotype: Recurrence score of 14, 10-year risk of distant recurrence with tamoxifen alone 9% - Started on  tamoxifen in 2017. - Changed to Letrozole in 2019; s/p menopause  -Most recent mammogram report was negative for  any disease recurrence.  Mammogram was completed at patient's gynecologist's office in [place]. -Physical exam today was negative for any evidence of disease. -Recommend patient continue on letrozole daily.  Recommend repeat diagnostic mammogram in 1 year.  2.  Bone health -DEXA scan in April 2019 revealed a T score of -1.5 which is consistent with osteopenia. -Recommend patient continue with vitamin D and calcium supplements.      Orders placed this encounter:  No orders of the defined types were placed in this  encounter.     [place] [phone]

## 2019-10-27 ENCOUNTER — Other Ambulatory Visit (HOSPITAL_COMMUNITY): Payer: Self-pay | Admitting: Internal Medicine

## 2019-11-02 NOTE — Telephone Encounter (Signed)
Refill request

## 2020-05-03 ENCOUNTER — Encounter: Payer: Self-pay | Admitting: Obstetrics & Gynecology

## 2020-05-04 ENCOUNTER — Encounter: Payer: Self-pay | Admitting: Gastroenterology

## 2020-05-11 ENCOUNTER — Ambulatory Visit (HOSPITAL_COMMUNITY): Payer: BC Managed Care – PPO | Admitting: Nurse Practitioner

## 2020-05-12 ENCOUNTER — Inpatient Hospital Stay (HOSPITAL_COMMUNITY): Payer: BC Managed Care – PPO | Attending: Nurse Practitioner | Admitting: Nurse Practitioner

## 2020-05-12 DIAGNOSIS — Z8249 Family history of ischemic heart disease and other diseases of the circulatory system: Secondary | ICD-10-CM | POA: Diagnosis not present

## 2020-05-12 DIAGNOSIS — Z79810 Long term (current) use of selective estrogen receptor modulators (SERMs): Secondary | ICD-10-CM | POA: Insufficient documentation

## 2020-05-12 DIAGNOSIS — Z833 Family history of diabetes mellitus: Secondary | ICD-10-CM | POA: Diagnosis not present

## 2020-05-12 DIAGNOSIS — Z170 Estrogen receptor positive status [ER+]: Secondary | ICD-10-CM | POA: Diagnosis not present

## 2020-05-12 DIAGNOSIS — Z923 Personal history of irradiation: Secondary | ICD-10-CM | POA: Diagnosis not present

## 2020-05-12 DIAGNOSIS — Z87891 Personal history of nicotine dependence: Secondary | ICD-10-CM | POA: Diagnosis not present

## 2020-05-12 DIAGNOSIS — Z79899 Other long term (current) drug therapy: Secondary | ICD-10-CM | POA: Diagnosis not present

## 2020-05-12 DIAGNOSIS — Z800 Family history of malignant neoplasm of digestive organs: Secondary | ICD-10-CM | POA: Insufficient documentation

## 2020-05-12 DIAGNOSIS — K219 Gastro-esophageal reflux disease without esophagitis: Secondary | ICD-10-CM | POA: Insufficient documentation

## 2020-05-12 DIAGNOSIS — C50211 Malignant neoplasm of upper-inner quadrant of right female breast: Secondary | ICD-10-CM | POA: Diagnosis not present

## 2020-05-12 DIAGNOSIS — I10 Essential (primary) hypertension: Secondary | ICD-10-CM | POA: Insufficient documentation

## 2020-05-12 MED ORDER — LETROZOLE 2.5 MG PO TABS: 2.5000 mg | ORAL_TABLET | Freq: Every day | ORAL | 5 refills | Status: DC

## 2020-05-12 NOTE — Assessment & Plan Note (Signed)
1.  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 -Pathology of the right breast biopsy showed invasive ductal carcinoma, ER positive PR positive, HER-2 negative Ki-67 30% and p53 is negative. -Right axillary sentinel node biopsy showed invasive ductal carcinoma 2 cm, sentinel node micrometastatic carcinoma 0.2 mm, total of 3 sentinel nodes 1/3 with micro mets. -Status post adjuvant XRT. -Oncotype DX on 05/2016 recurrence score of 14, 10-year risk of distant recurrence with tamoxifen alone 9%. -Started on tamoxifen 2017. -Changed to letrozole 2019 status post menopause.  She is tolerating this very well. -Most recent mammogram on 05/03/2020 from [facility] in [place] showed B RADS category 2 benign -We recommend she continue letrozole. -She will follow-up in 1 year with repeat labs and mammogram.  2.  Bone health: -Last DEXA scan on 02/2018 revealed a T score of -1.5 which is consistent with osteopenia. -Patient is continue to take her calcium and vitamin D daily.

## 2020-05-12 NOTE — Progress Notes (Signed)
[place], [place] [address]   CLINIC:  Medical Oncology/Hematology  PCP:  Patient, No Pcp Per No address on file None   REASON FOR VISIT: Follow-up for breast cancer   CURRENT THERAPY: Letrozole  BRIEF ONCOLOGIC HISTORY:  Oncology History  Breast cancer of upper-inner quadrant of right female breast ([place])  03/28/2016 Mammogram   1.9 cm irregular spiculated mass in the upper inner quadrant of right breast.   04/19/2016 Pathology Results   [facility] pathology-infiltrating ductal carcinoma of right breast.   05/03/2016 Pathology Results   [facility] pathology consult- Consult [place] , right breast biopsy - INVASIVE DUCTAL CARCINOMA. ER (95%, strong staining intensity) and PR (95%, strong staining intensity). Her2 is negative (1+). Ki-67 reveals a proliferation rate of 30% and p53 is negative.   05/09/2016 Procedure   Right axillary lymph node biopsy   05/09/2016 Pathology Results   Lymph node, needle/core biopsy, right axillary LYMPHOID TISSUE, NEGATIVE FOR CARCINOMA.   05/15/2016 Surgery   R breast seed guided lumpectomy, R axillary SN biopsy   05/15/2016 Pathology Results   invasive ductal carcinoma 2 cm, sentinel node micrometastatic carcinoma 0.2 mm, total 3 sentinel nodes 1/3 with micromets, ER 95%, PR 95%, Her 2 neu - Ki-67 30% pT1c, pN1MipNX    Radiation Therapy    The patient saw No care team member to display for radiation treatment. This is the current list of radiation treatment: Radiation Treatments       Patient's record has no active or historical radiation treatments documented.          05/21/2016 Oncotype testing   Recurrence score results of 14, 10 year risk of distant recurrence with tamoxifen alone 9%      INTERVAL HISTORY:  Rachael Villa 54 y.o. female returns for routine follow-up for breast cancer.  Patient reports she is doing well since her last visit.  She is taking her letrozole as prescribed.  With no unwanted side  effects.  She still has the occasional hot flash. Denies any nausea, vomiting, or diarrhea. Denies any new pains. Had not noticed any recent bleeding such as epistaxis, hematuria or hematochezia. Denies recent chest pain on exertion, shortness of breath on minimal exertion, pre-syncopal episodes, or palpitations. Denies any numbness or tingling in hands or feet. Denies any recent fevers, infections, or recent hospitalizations. Patient reports appetite at 100% and energy level at 75%.  She is eating well maintain her weight this time.     REVIEW OF SYSTEMS:  Review of Systems  All other systems reviewed and are negative.    PAST MEDICAL/SURGICAL HISTORY:  Past Medical History:  Diagnosis Date   Breast cancer of upper-outer quadrant of right female breast ([facility]) 05/03/2016   GERD (gastroesophageal reflux disease)    less since gallbladder removed   Headache    while on BCP   Hypertension    Past Surgical History:  Procedure Laterality Date   CHOLECYSTECTOMY  2011   HERNIA REPAIR  2017   UHR   RADIOACTIVE SEED GUIDED PARTIAL MASTECTOMY WITH AXILLARY SENTINEL LYMPH NODE BIOPSY Right 05/15/2016   Procedure: RADIOACTIVE SEED GUIDED RIGHT BREAST LUMPECTOMY WITH AXILLARY SENTINEL LYMPH NODE BIOPSY;  Surgeon: Rolm Bookbinder, MD;  Location: [facility];  Service: General;  Laterality: Right;   TUBAL LIGATION     WISDOM TOOTH EXTRACTION       SOCIAL HISTORY:  Social History   Socioeconomic History   Marital status: Married  Spouse name: Not on file   Number of children: Not on file   Years of education: Not on file   Highest education level: Not on file  Occupational History   Not on file  Tobacco Use   Smoking status: Former Smoker   Smokeless tobacco: Never Used  Substance and Sexual Activity   Alcohol use: Yes    Comment: social   Drug use: No   Sexual activity: Yes    Birth control/protection: Surgical    Comment: TL  Other Topics  Concern   Not on file  Social History Narrative   Not on file   Social Determinants of Health   Financial Resource Strain:    Difficulty of Paying Living Expenses:   Food Insecurity:    Worried About Charity fundraiser in the Last Year:    Arboriculturist in the Last Year:   Transportation Needs:    Film/video editor (Medical):    Lack of Transportation (Non-Medical):   Physical Activity:    Days of Exercise per Week:    Minutes of Exercise per Session:   Stress:    Feeling of Stress :   Social Connections:    Frequency of Communication with Friends and Family:    Frequency of Social Gatherings with Friends and Family:    Attends Religious Services:    Active Member of Clubs or Organizations:    Attends Music therapist:    Marital Status:   Intimate Partner Violence:    Fear of Current or Ex-Partner:    Emotionally Abused:    Physically Abused:    Sexually Abused:     FAMILY HISTORY:  Family History  Problem Relation Age of Onset   Diabetes Mother    Hypertension Mother    Cirrhosis Mother        NAFLD   Hypertension Father    Cancer Father        colon    CURRENT MEDICATIONS:  Outpatient Encounter Medications as of 05/12/2020  Medication Sig   Black Pepper-Turmeric (TURMERIC CURCUMIN) 03-999 MG CAPS Take 1 capsule by mouth daily.   Calcium Carb-Cholecalciferol (CALCIUM-VITAMIN D3) 600-400 MG-UNIT TABS Take 600 mg by mouth 2 (two) times daily.   cetirizine (ZYRTEC) 10 MG tablet Take 10 mg by mouth daily.   cyclobenzaprine (FLEXERIL) 5 MG tablet Take 5 mg by mouth at bedtime.    hydrochlorothiazide (HYDRODIURIL) 25 MG tablet Take 25 mg by mouth daily.    letrozole (FEMARA) 2.5 MG tablet TAKE 1 TABLET BY MOUTH EVERY DAY   Magnesium 250 MG TABS Take 250 mg by mouth daily.   Omega-3 Fatty Acids (FISH OIL) 1000 MG CAPS Take 1,000 mg by mouth daily.   omeprazole (PRILOSEC OTC) 20 MG tablet Take 20 mg by mouth as  needed.   Probiotic Product (PROBIOTIC ADVANCED PO) Take by mouth.   valsartan (DIOVAN) 80 MG tablet Take 80 mg by mouth daily.   ALPRAZolam (XANAX) 0.25 MG tablet Take 0.25 mg by mouth at bedtime as needed for anxiety. (Patient not taking: Reported on 05/12/2020)   [DISCONTINUED] losartan (COZAAR) 50 MG tablet Take 50 mg by mouth daily.   No facility-administered encounter medications on file as of 05/12/2020.    ALLERGIES:  Allergies  Allergen Reactions   Shrimp [Shellfish Allergy] Hives, Itching and Rash     PHYSICAL EXAM:  ECOG Performance status: 1  Vitals:   05/12/20 0943  BP: (!) 158/82  Pulse: 74  Resp: 18  Temp: (!) 97.1 F (36.2 C)  SpO2: 100%   Filed Weights   05/12/20 0943  Weight: 192 lb 4.8 oz (87.2 kg)   Physical Exam Constitutional:      Appearance: Normal appearance. She is normal weight.  Cardiovascular:     Rate and Rhythm: Normal rate and regular rhythm.     Heart sounds: Normal heart sounds.  Pulmonary:     Effort: Pulmonary effort is normal.     Breath sounds: Normal breath sounds.  Abdominal:     General: Bowel sounds are normal.     Palpations: Abdomen is soft.  Musculoskeletal:        General: Normal range of motion.  Skin:    General: Skin is warm.  Neurological:     Mental Status: She is alert and oriented to person, place, and time. Mental status is at baseline.  Psychiatric:        Mood and Affect: Mood normal.        Behavior: Behavior normal.        Thought Content: Thought content normal.        Judgment: Judgment normal.      LABORATORY DATA:  I have reviewed the labs as listed.  CBC    Component Value Date/Time   WBC 5.7 02/04/2018 1405   RBC 4.15 02/04/2018 1405   HGB 12.9 02/04/2018 1405   HCT 38.6 02/04/2018 1405   PLT 190 02/04/2018 1405   MCV 93.0 02/04/2018 1405   MCH 31.1 02/04/2018 1405   MCHC 33.4 02/04/2018 1405   RDW 12.3 02/04/2018 1405   LYMPHSABS 1.8 02/04/2018 1405   MONOABS 0.3 02/04/2018 1405    EOSABS 0.3 02/04/2018 1405   BASOSABS 0.0 02/04/2018 1405   CMP Latest Ref Rng & Units 02/04/2018 08/06/2017 01/06/2017  Glucose 65 - 99 mg/dL 99 123(H) 128(H)  BUN 6 - 20 mg/dL 14 10 15   Creatinine 0.44 - 1.00 mg/dL 0.95 1.01(H) 1.03(H)  Sodium 135 - 145 mmol/L 138 137 138  Potassium 3.5 - 5.1 mmol/L 4.0 3.7 3.5  Chloride 101 - 111 mmol/L 102 101 101  CO2 22 - 32 mmol/L 26 26 29   Calcium 8.9 - 10.3 mg/dL 9.7 9.3 9.2  Total Protein 6.5 - 8.1 g/dL 7.4 7.4 6.9  Total Bilirubin 0.3 - 1.2 mg/dL 1.0 0.8 0.8  Alkaline Phos 38 - 126 U/L 59 59 58  AST 15 - 41 U/L 40 57(H) 31  ALT 14 - 54 U/L 64(H) 81(H) 50    DIAGNOSTIC IMAGING:  I have independently reviewed the mammogram scans and discussed with the patient.  ASSESSMENT & PLAN:  Breast cancer of upper-inner quadrant of right female breast ([facility]) 1.  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 -Pathology of the right breast biopsy showed invasive ductal carcinoma, ER positive PR positive, HER-2 negative Ki-67 30% and p53 is negative. -Right axillary sentinel node biopsy showed invasive ductal carcinoma 2 cm, sentinel node micrometastatic carcinoma 0.2 mm, total of 3 sentinel nodes 1/3 with micro mets. -Status post adjuvant XRT. -Oncotype DX on 05/2016 recurrence score of 14, 10-year risk of distant recurrence with tamoxifen alone 9%. -Started on tamoxifen 2017. -Changed to letrozole 2019 status post menopause.  She is tolerating this very well. -Most recent mammogram on 05/03/2020 from [facility] in [place] showed B RADS category 2 benign -We recommend  she continue letrozole. -She will follow-up in 1 year with repeat labs and mammogram.  2.  Bone health: -Last DEXA scan on 02/2018 revealed a T score of -1.5 which is consistent with osteopenia. -Patient is continue to take her calcium and vitamin D  daily.     Orders placed this encounter:  No orders of the defined types were placed in this encounter.     Francene Finders, FNP-C [place] [phone]

## 2020-06-23 ENCOUNTER — Other Ambulatory Visit: Payer: Self-pay

## 2020-06-23 ENCOUNTER — Ambulatory Visit (AMBULATORY_SURGERY_CENTER): Payer: Self-pay | Admitting: *Deleted

## 2020-06-23 ENCOUNTER — Encounter: Payer: Self-pay | Admitting: Gastroenterology

## 2020-06-23 VITALS — Ht 64.0 in | Wt 185.0 lb

## 2020-06-23 DIAGNOSIS — Z01818 Encounter for other preprocedural examination: Secondary | ICD-10-CM

## 2020-06-23 DIAGNOSIS — Z1211 Encounter for screening for malignant neoplasm of colon: Secondary | ICD-10-CM

## 2020-06-23 MED ORDER — NA SULFATE-K SULFATE-MG SULF 17.5-3.13-1.6 GM/177ML PO SOLN: 1.0000 | Freq: Once | ORAL | 0 refills | Status: AC

## 2020-06-23 NOTE — Progress Notes (Signed)

## 2020-07-04 ENCOUNTER — Ambulatory Visit (INDEPENDENT_AMBULATORY_CARE_PROVIDER_SITE_OTHER): Payer: Self-pay

## 2020-07-04 ENCOUNTER — Other Ambulatory Visit: Payer: Self-pay | Admitting: Gastroenterology

## 2020-07-04 ENCOUNTER — Other Ambulatory Visit: Payer: Self-pay

## 2020-07-04 DIAGNOSIS — Z1159 Encounter for screening for other viral diseases: Secondary | ICD-10-CM

## 2020-07-05 ENCOUNTER — Telehealth: Payer: Self-pay | Admitting: Gastroenterology

## 2020-07-05 LAB — SARS CORONAVIRUS 2 (TAT 6-24 HRS): SARS Coronavirus 2: NEGATIVE

## 2020-07-07 ENCOUNTER — Encounter: Payer: BC Managed Care – PPO | Admitting: Gastroenterology

## 2021-05-25 ENCOUNTER — Other Ambulatory Visit (HOSPITAL_COMMUNITY): Payer: BC Managed Care – PPO

## 2021-06-01 ENCOUNTER — Ambulatory Visit (HOSPITAL_COMMUNITY): Payer: BC Managed Care – PPO | Admitting: Nurse Practitioner

## 2021-06-04 ENCOUNTER — Other Ambulatory Visit (HOSPITAL_COMMUNITY): Payer: Self-pay | Admitting: Surgery

## 2021-06-04 DIAGNOSIS — C50211 Malignant neoplasm of upper-inner quadrant of right female breast: Secondary | ICD-10-CM

## 2021-06-04 DIAGNOSIS — Z170 Estrogen receptor positive status [ER+]: Secondary | ICD-10-CM

## 2021-06-04 MED ORDER — LETROZOLE 2.5 MG PO TABS: 2.5000 mg | ORAL_TABLET | Freq: Every day | ORAL | 5 refills | Status: DC

## 2021-06-07 ENCOUNTER — Ambulatory Visit (HOSPITAL_COMMUNITY): Payer: Self-pay | Admitting: Hematology

## 2021-06-21 ENCOUNTER — Telehealth (HOSPITAL_COMMUNITY): Payer: Self-pay | Admitting: Nurse Practitioner

## 2021-06-21 ENCOUNTER — Ambulatory Visit (HOSPITAL_COMMUNITY): Payer: Self-pay | Admitting: Hematology

## 2021-06-25 ENCOUNTER — Other Ambulatory Visit: Payer: Self-pay

## 2021-06-25 ENCOUNTER — Inpatient Hospital Stay (HOSPITAL_COMMUNITY): Payer: BC Managed Care – PPO | Attending: Hematology | Admitting: Nurse Practitioner

## 2021-06-25 VITALS — BP 139/77 | HR 87 | Temp 98.4°F | Resp 16 | Wt 205.9 lb

## 2021-06-25 DIAGNOSIS — C50211 Malignant neoplasm of upper-inner quadrant of right female breast: Secondary | ICD-10-CM

## 2021-06-25 DIAGNOSIS — Z170 Estrogen receptor positive status [ER+]: Secondary | ICD-10-CM | POA: Diagnosis not present

## 2021-06-25 DIAGNOSIS — Z79811 Long term (current) use of aromatase inhibitors: Secondary | ICD-10-CM

## 2021-06-25 NOTE — Progress Notes (Signed)
 [facility] [address] , [place] [address]   Virtual Visit Progress Note  I connected with Rachael Villa on 06/25/21 at 10:00 AM EDT by video enabled telemedicine visit and verified that I am speaking with the correct person using two identifiers.   I discussed the limitations, risks, security and privacy concerns of performing an evaluation and management service by telemedicine and the availability of in-person appointments. I also discussed with the patient that there may be a patient responsible charge related to this service. The patient expressed understanding and agreed to proceed.   Other persons participating in the visit and their role in the encounter: RN, NP, Patient   Patient's location: [facility]  Provider's location: [facility]   CLINIC:  Medical Oncology/Hematology  PCP:  Patient, No Pcp Per (Inactive) No address on file None   REASON FOR VISIT: Follow-up for breast cancer   CURRENT THERAPY: Letrozole  BRIEF ONCOLOGIC HISTORY:  Oncology History  Breast cancer of upper-inner quadrant of right female breast (HCC)  03/28/2016 Mammogram   1.9 cm irregular spiculated mass in the upper inner quadrant of right breast.   04/19/2016 Pathology Results   [place] pathology-infiltrating ductal carcinoma of right breast.   05/03/2016 Pathology Results   [place] pathology consult- Consult [facility]ide , right breast biopsy - INVASIVE DUCTAL CARCINOMA. ER (95%, strong staining intensity) and PR (95%, strong staining intensity). Her2 is negative (1+). Ki-67 reveals a proliferation rate of 30% and p53 is negative.   05/09/2016 Procedure   Right axillary lymph node biopsy   05/09/2016 Pathology Results   Lymph node, needle/core biopsy, right axillary LYMPHOID TISSUE, NEGATIVE FOR CARCINOMA.   05/15/2016 Surgery   R breast seed guided lumpectomy, R axillary SN biopsy   05/15/2016 Pathology Results   invasive ductal carcinoma 2 cm, sentinel node micrometastatic carcinoma 0.2  mm, total 3 sentinel nodes 1/3 with micromets, ER 95%, PR 95%, Her 2 neu - Ki-67 30% pT1c, pN1MipNX    Radiation Therapy    The patient saw No care team member to display for radiation treatment. This is the current list of radiation treatment: Radiation Treatments        Patient's record has no active or historical radiation treatments documented.           05/21/2016 Oncotype testing   Recurrence score results of 14, 10 year risk of distant recurrence with tamoxifen alone 9%      INTERVAL HISTORY:  Rachael Villa 54 y.o. female returns for routine follow-up for breast cancer.  She continues letrozole.  Tolerating well without significant side effects.  No new lumps or bumps.  She performs self breast exams.  Had mammogram and blood work with her PCP in [place].  Note, labs, and mammogram are reviewed today.  She feels well. Denies any neurologic complaints. Denies recent fevers or illnesses. Denies any easy bleeding or bruising. No melena or hematochezia. Reports good appetite and denies weight loss. Denies chest pain. Denies any nausea, vomiting, constipation, or diarrhea. Denies urinary complaints. Patient offers no further specific complaints today.  REVIEW OF SYSTEMS:  Review of Systems  Constitutional:  Negative for appetite change, fatigue and unexpected weight change.  HENT:   Negative for mouth sores, sore throat and trouble swallowing.   Respiratory:  Negative for chest tightness and shortness of breath.   Cardiovascular:  Negative for leg swelling.  Gastrointestinal:  Negative for abdominal pain, constipation, diarrhea, nausea and vomiting.  Genitourinary:  Negative for bladder incontinence   and dysuria.   Musculoskeletal:  Negative for flank pain and neck stiffness.  Skin:  Negative for itching, rash and wound.  Neurological:  Negative for dizziness, headaches, light-headedness and numbness.  Psychiatric/Behavioral:  Negative for confusion, depression and sleep disturbance.  The patient is not nervous/anxious.   All other systems reviewed and are negative.   PAST MEDICAL/SURGICAL HISTORY:  Past Medical History:  Diagnosis Date   Breast cancer of upper-outer quadrant of right female breast ([place]) 05/03/2016   Headache    while on BCP   Hypertension    Migraine    Past Surgical History:  Procedure Laterality Date   CHOLECYSTECTOMY  2011   HERNIA REPAIR  2017   UHR   RADIOACTIVE SEED GUIDED PARTIAL MASTECTOMY WITH AXILLARY SENTINEL LYMPH NODE BIOPSY Right 05/15/2016   Procedure: RADIOACTIVE SEED GUIDED RIGHT BREAST LUMPECTOMY WITH AXILLARY SENTINEL LYMPH NODE BIOPSY;  Surgeon: Rolm Bookbinder, MD;  Location: [employer];  Service: General;  Laterality: Right;   TUBAL LIGATION     WISDOM TOOTH EXTRACTION       SOCIAL HISTORY:  Social History   Socioeconomic History   Marital status: Married    Spouse name: Not on file   Number of children: Not on file   Years of education: Not on file   Highest education level: Not on file  Occupational History   Not on file  Tobacco Use   Smoking status: Former   Smokeless tobacco: Never  Substance and Sexual Activity   Alcohol use: Yes    Comment: social   Drug use: No   Sexual activity: Yes    Birth control/protection: Surgical    Comment: TL  Other Topics Concern   Not on file  Social History Narrative   Not on file   Social Determinants of Health   Financial Resource Strain: Not on file  Food Insecurity: Not on file  Transportation Needs: Not on file  Physical Activity: Not on file  Stress: Not on file  Social Connections: Not on file  Intimate Partner Violence: Not on file    FAMILY HISTORY:  Family History  Problem Relation Age of Onset   Diabetes Mother    Hypertension Mother    Cirrhosis Mother        NAFLD   Hypertension Father    Cancer Father        colon   Esophageal cancer Neg Hx    Stomach cancer Neg Hx    Colon cancer Neg Hx     CURRENT MEDICATIONS:   Outpatient Encounter Medications as of 06/25/2021  Medication Sig   ALPRAZolam (XANAX) 0.25 MG tablet Take 0.25 mg by mouth at bedtime as needed for anxiety.    Black Pepper-Turmeric (TURMERIC CURCUMIN) 03-999 MG CAPS Take 1 capsule by mouth daily.   Calcium Carb-Cholecalciferol (CALCIUM-VITAMIN D3) 600-400 MG-UNIT TABS Take 600 mg by mouth 2 (two) times daily.   cetirizine (ZYRTEC) 10 MG tablet Take 10 mg by mouth daily.   cyclobenzaprine (FLEXERIL) 5 MG tablet Take 5 mg by mouth at bedtime.  (Patient not taking: Reported on 06/23/2020)   hydrochlorothiazide (HYDRODIURIL) 25 MG tablet Take 25 mg by mouth daily.    letrozole (FEMARA) 2.5 MG tablet Take 1 tablet (2.5 mg total) by mouth daily.   Magnesium 250 MG TABS Take 250 mg by mouth daily.   Omega-3 Fatty Acids (FISH OIL) 1000 MG CAPS Take 1,000 mg by mouth daily.   omeprazole (PRILOSEC OTC) 20 MG tablet Take  20 mg by mouth as needed.   Probiotic Product (PROBIOTIC ADVANCED PO) Take by mouth.   valsartan (DIOVAN) 80 MG tablet Take 80 mg by mouth daily.   No facility-administered encounter medications on file as of 06/25/2021.    ALLERGIES:  Allergies  Allergen Reactions   Shrimp [Shellfish Allergy] Hives, Itching and Rash     PHYSICAL EXAM:  ECOG Performance status: 0  Vitals:   06/25/21 1000  BP: 139/77  Pulse: 87  Resp: 16  Temp: 98.4 F (36.9 C)  SpO2: 99%   Filed Weights   06/25/21 1000  Weight: 205 lb 14.4 oz (93.4 kg)   Physical Exam Constitutional:      General: She is not in acute distress. HENT:     Head: Normocephalic.  Pulmonary:     Effort: No respiratory distress.  Neurological:     Mental Status: She is alert and oriented to person, place, and time.  Psychiatric:        Mood and Affect: Mood normal.        Behavior: Behavior normal.     LABORATORY DATA:  I have reviewed the labs as listed.  CBC    Component Value Date/Time   WBC 5.7 02/04/2018 1405   RBC 4.15 02/04/2018 1405   HGB 12.9  02/04/2018 1405   HCT 38.6 02/04/2018 1405   PLT 190 02/04/2018 1405   MCV 93.0 02/04/2018 1405   MCH 31.1 02/04/2018 1405   MCHC 33.4 02/04/2018 1405   RDW 12.3 02/04/2018 1405   LYMPHSABS 1.8 02/04/2018 1405   MONOABS 0.3 02/04/2018 1405   EOSABS 0.3 02/04/2018 1405   BASOSABS 0.0 02/04/2018 1405   CMP Latest Ref Rng & Units 02/04/2018 08/06/2017 01/06/2017  Glucose 65 - 99 mg/dL 99 123(H) 128(H)  BUN 6 - 20 mg/dL _0 Creatinine 0.44 - 1.00 mg/dL 0.95 1.01(H) 1.03(H)  Sodium 135 - 145 mmol/L 138 137 138  Potassium 3.5 - 5.1 mmol/L 4.0 3.7 3.5  Chloride 101 - 111 mmol/L 102 101 101  CO2 22 - 32 mmol/L _1 Calcium 8.9 - 10.3 mg/dL 9.7 9.3 9.2  Total Protein 6.5 - 8.1 g/dL 7.4 7.4 6.9  Total Bilirubin 0.3 - 1.2 mg/dL 1.0 0.8 0.8  Alkaline Phos 38 - 126 U/L 59 59 58  AST 15 - 41 U/L 40 57(H) 31  ALT 14 - 54 U/L 64(H) 81(H) 50    DIAGNOSTIC IMAGING:  I have independently reviewed the mammogram scans and discussed with the patient.  ASSESSMENT & PLAN:  No problem-specific Assessment & Plan notes found for this encounter.  1.  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 -Pathology of the right breast biopsy showed invasive ductal carcinoma, ER positive PR positive, HER-2 negative Ki-67 30% and p53 is negative. -Right axillary sentinel node biopsy showed invasive ductal carcinoma 2 cm, sentinel node micrometastatic carcinoma 0.2 mm, total of 3 sentinel nodes 1/3 with micro mets. -Status post adjuvant XRT. -Oncotype DX on 05/2016 recurrence score of 14, 10-year risk of distant recurrence with tamoxifen alone 9%. -Started on tamoxifen 2017. -Changed to letrozole 2019 status post menopause. Tolerating well.  - She has now completed 5 years of hormonal therapy. We discussed discontinuing AI vs BCI testing to determine benefit of extended adjuvant treatment vs  staying on letrozole for 10 years. Patient elected to continue letrozole.  -  [facility]ammogram from June [facility], 2021 [facility] in [place] was benign without evidence of malignancy, new or recurrent. Plan to repeat in 1 year - continue letrozole - per nccn surveillance guidelines, rtc in 1 year for follow up    2.  Bone health: - discussed that she is at risk of bone loss given use of AI.  -Last DEXA scan on 02/2018 revealed a T score of -1.5 which is consistent with osteopenia. -Patient is continue to take her calcium 1200 and vitamin D 1000 iu daily. Labs reviewed from her pcp and vitamin d was within goal.  - Plan to repeat dexa scan.    Orders placed this encounter:  No orders of the defined types were placed in this encounter.   I discussed the assessment and treatment plan with the patient. The patient was provided an opportunity to ask questions and all were answered. The patient agreed with the plan and demonstrated an understanding of the instructions.   The patient was advised to call back or seek an in-person evaluation if the symptoms worsen or if the condition fails to improve as anticipated.   I spent 20 minutes face-to-face video visit time dedicated to the care of this patient on the date of this encounter to include pre-visit review of medical oncology notes, imaging, face-to-face time with the patient, and post visit ordering of testing/documentation.   Beckey Rutter, DNP, AGNP-C [place] [phone]

## 2021-06-25 NOTE — Patient Instructions (Signed)
[place] at [facility] Discharge Instructions  You were seen and examined today by Beckey Rutter, NP. She has recommended continuing Vit D (1000 units) and adding back Calcium (1200 mg).   Lauren has recommended a repeat bone density scan. Repeat labs and mammogram in one year with follow-up.   Thank you for choosing [place] at [facility] to provide your oncology and hematology care.  To afford each patient quality time with our provider, please arrive at least 15 minutes before your scheduled appointment time.   If you have a lab appointment with the [facility] please come in thru the Main Entrance and check in at the main information desk.  You need to re-schedule your appointment should you arrive 10 or more minutes late.  We strive to give you quality time with our providers, and arriving late affects you and other patients whose appointments are after yours.  Also, if you no show three or more times for appointments you may be dismissed from the clinic at the providers discretion.     Again, thank you for choosing [facility].  Our hope is that these requests will decrease the amount of time that you wait before being seen by our physicians.       _____________________________________________________________  Should you have questions after your visit to [facility], please contact our office at [phone] and follow the prompts.  Our office hours are 8:00 a.m. and 4:30 p.m. Monday - Friday.  Please note that voicemails left after 4:00 p.m. may not be returned until the following business day.  We are closed weekends and major holidays.  You do have access to a nurse 24-7, just call the main number to the clinic [phone] and do not press any options, hold on the line and a nurse will answer the phone.    For prescription refill requests, have your pharmacy contact our office and allow 72 hours.    Due to  Covid, you will need to wear a mask upon entering the hospital. If you do not have a mask, a mask will be given to you at the Main Entrance upon arrival. For doctor visits, patients may have 1 support person age 43 or older with them. For treatment visits, patients can not have anyone with them due to social distancing guidelines and our immunocompromised population.

## 2022-06-12 ENCOUNTER — Other Ambulatory Visit: Payer: Self-pay | Admitting: Hematology

## 2022-06-12 DIAGNOSIS — C50211 Malignant neoplasm of upper-inner quadrant of right female breast: Secondary | ICD-10-CM

## 2022-06-27 NOTE — Progress Notes (Deleted)
RESCHEDULE 

## 2022-06-28 ENCOUNTER — Inpatient Hospital Stay: Payer: BC Managed Care – PPO | Admitting: Physician Assistant

## 2022-07-18 ENCOUNTER — Other Ambulatory Visit: Payer: Self-pay | Admitting: Physician Assistant

## 2022-07-18 DIAGNOSIS — M8580 Other specified disorders of bone density and structure, unspecified site: Secondary | ICD-10-CM

## 2022-07-18 DIAGNOSIS — C50211 Malignant neoplasm of upper-inner quadrant of right female breast: Secondary | ICD-10-CM

## 2022-07-18 DIAGNOSIS — Z79811 Long term (current) use of aromatase inhibitors: Secondary | ICD-10-CM

## 2022-07-18 NOTE — Progress Notes (Signed)
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   04/19/2016 Pathology Results   [facility]-infiltrating ductal carcinoma of right breast.   05/03/2016 Pathology Results   [facility] consult- Consult [facility] , right breast biopsy - INVASIVE DUCTAL CARCINOMA. ER (95%, strong staining intensity) and PR (95%, strong staining intensity). Her2 is negative (1+). Ki-67 reveals a proliferation rate of 30% and p53 is negative.   05/09/2016 Procedure   Right axillary lymph node biopsy   05/09/2016 Pathology Results   Lymph node, needle/core biopsy, right axillary LYMPHOID TISSUE, NEGATIVE FOR CARCINOMA.   05/15/2016 Surgery   R breast seed guided lumpectomy, R axillary SN biopsy   05/15/2016 Pathology Results   invasive ductal carcinoma 2 cm, sentinel node micrometastatic carcinoma 0.2 mm, total 3 sentinel nodes 1/3 with micromets, ER 95%, PR 95%, Her 2 neu - Ki-67 30% pT1c, [id number]    Radiation Therapy    The patient saw No care team member to display for radiation treatment. This is the current list of radiation treatment: Radiation Treatments        Patient's record has no active or historical radiation treatments documented.           05/21/2016 Oncotype testing   Recurrence score results of 14, 10 year risk of distant recurrence with tamoxifen alone 9%     CANCER STAGING: Cancer Staging  No matching staging  information was found for the patient.  INTERVAL HISTORY:  Ms. Reylynn Vanalstine, a 56 y.o. female, returns for routine follow-up of her history of stage Ib right-sided breast cancer (2017). Annalyssa was last seen on 06/25/2021 via virtual evaluation by NP Beckey Rutter.   At today's visit, she  reports feeling well.  She denies any recent hospitalizations, surgeries, or changes in her  baseline health status.  She denies any symptoms of recurrence such as new lumps, bone pain, chest pain, dyspnea, or abdominal pain.  She has no new headaches, seizures, or focal neurologic deficits.  No B symptoms such as fever, chills, night sweats, unintentional weight loss.  She continues to take letrozole daily and is tolerating it well without any adverse effects.  She stopped taking her calcium and vitamin D about 6 months ago.  She does not take any medication for osteopenia.  She reports 100% energy and 100% appetite.  She has lost about 30 pounds after starting Wegovy (intentional weight loss).   REVIEW OF SYSTEMS:  Review of Systems  Constitutional:  Negative for appetite change, chills, diaphoresis, fatigue, fever and unexpected weight change.  HENT:   Negative for lump/mass and nosebleeds.   Eyes:  Negative for eye problems.  Respiratory:  Negative for cough, hemoptysis and shortness of breath.   Cardiovascular:  Negative for chest pain, leg swelling and palpitations.  Gastrointestinal:  Negative for abdominal pain, blood in stool, constipation, diarrhea, nausea and vomiting.  Genitourinary:  Negative for hematuria.   Skin: Negative.   Neurological:  Negative  for dizziness, headaches and light-headedness.  Hematological:  Does not bruise/bleed easily.    PAST MEDICAL/SURGICAL HISTORY:  Past Medical History:  Diagnosis Date   Breast cancer of upper-outer quadrant of right female breast ([facility]) 05/03/2016   Headache    while on BCP   Hypertension    Migraine    Past Surgical History:  Procedure  Laterality Date   CHOLECYSTECTOMY  2011   HERNIA REPAIR  2017   UHR   RADIOACTIVE SEED GUIDED PARTIAL MASTECTOMY WITH AXILLARY SENTINEL LYMPH NODE BIOPSY Right 05/15/2016   Procedure: RADIOACTIVE SEED GUIDED RIGHT BREAST LUMPECTOMY WITH AXILLARY SENTINEL LYMPH NODE BIOPSY;  Surgeon: Rolm Bookbinder, MD;  Location: [facility];  Service: General;  Laterality: Right;   TUBAL LIGATION     WISDOM TOOTH EXTRACTION      SOCIAL HISTORY:  Social History   Socioeconomic History   Marital status: Married    Spouse name: Not on file   Number of children: Not on file   Years of education: Not on file   Highest education level: Not on file  Occupational History   Not on file  Tobacco Use   Smoking status: Former   Smokeless tobacco: Never  Substance and Sexual Activity   Alcohol use: Yes    Comment: social   Drug use: No   Sexual activity: Yes    Birth control/protection: Surgical    Comment: TL  Other Topics Concern   Not on file  Social History Narrative   Not on file   Social Determinants of Health   Financial Resource Strain: Not on file  Food Insecurity: Not on file  Transportation Needs: Not on file  Physical Activity: Not on file  Stress: Not on file  Social Connections: Not on file  Intimate Partner Violence: Not on file    FAMILY HISTORY:  Family History  Problem Relation Age of Onset   Diabetes Mother    Hypertension Mother    Cirrhosis Mother        NAFLD   Hypertension Father    Cancer Father        colon   Esophageal cancer Neg Hx    Stomach cancer Neg Hx    Colon cancer Neg Hx     CURRENT MEDICATIONS:  Current Outpatient Medications  Medication Sig Dispense Refill   ALPRAZolam (XANAX) 0.25 MG tablet Take 0.25 mg by mouth at bedtime as needed for anxiety.      Ascorbic Acid (VITAMIN C) 1000 MG tablet      Black Pepper-Turmeric (TURMERIC CURCUMIN) 03-999 MG CAPS Take 1 capsule by mouth daily.     buPROPion (WELLBUTRIN XL) 150 MG 24 hr  tablet Take 150 mg by mouth daily.     Calcium Carb-Cholecalciferol (CALCIUM-VITAMIN D3) 600-400 MG-UNIT TABS Take 600 mg by mouth 2 (two) times daily.     cetirizine (ZYRTEC) 10 MG tablet Take 10 mg by mouth daily.     Cholecalciferol (VITAMIN D) 50 MCG (2000 UT) CAPS      hydrochlorothiazide (HYDRODIURIL) 25 MG tablet Take 25 mg by mouth daily.      letrozole (FEMARA) 2.5 MG tablet TAKE 1 TABLET BY MOUTH EVERY DAY 30 tablet 35   Magnesium 250 MG TABS Take 250 mg by mouth daily.     Omega-3 Fatty Acids (FISH OIL) 1000 MG CAPS Take 1,000 mg by mouth daily.     omeprazole (PRILOSEC OTC) 20 MG tablet Take 20 mg by mouth as needed.  Probiotic Product (PROBIOTIC ADVANCED PO) Take by mouth.     valsartan (DIOVAN) 80 MG tablet Take 80 mg by mouth daily.     Zinc 50 MG CAPS      No current facility-administered medications for this visit.    ALLERGIES:  Allergies  Allergen Reactions   Shrimp [Shellfish Allergy] Hives, Itching and Rash    PHYSICAL EXAM:  Performance status (ECOG): 0 - Asymptomatic  There were no vitals filed for this visit. Wt Readings from Last 3 Encounters:  06/25/21 205 lb 14.4 oz (93.4 kg)  06/23/20 185 lb (83.9 kg)  05/12/20 192 lb 4.8 oz (87.2 kg)   Physical Exam Constitutional:      Appearance: Normal appearance. She is obese.  HENT:     Head: Normocephalic and atraumatic.     Mouth/Throat:     Mouth: Mucous membranes are moist.  Eyes:     Extraocular Movements: Extraocular movements intact.     Pupils: Pupils are equal, round, and reactive to light.  Cardiovascular:     Rate and Rhythm: Normal rate and regular rhythm.     Pulses: Normal pulses.     Heart sounds: Normal heart sounds.  Pulmonary:     Effort: Pulmonary effort is normal.     Breath sounds: Normal breath sounds.  Chest:  Breasts:    Right: Tenderness present.     Left: Normal.       Comments: Right breast with mild dependent edema and tenderness.  No discrete nodules or masses  palpated in either breast. Abdominal:     General: Bowel sounds are normal.     Palpations: Abdomen is soft.     Tenderness: There is no abdominal tenderness.  Musculoskeletal:        General: No swelling.     Right lower leg: No edema.     Left lower leg: No edema.  Lymphadenopathy:     Cervical: No cervical adenopathy.  Skin:    General: Skin is warm and dry.  Neurological:     General: No focal deficit present.     Mental Status: She is alert and oriented to person, place, and time.  Psychiatric:        Mood and Affect: Mood normal.        Behavior: Behavior normal.      LABORATORY DATA:  I have reviewed the labs as listed.     Latest Ref Rng & Units 02/04/2018    2:05 PM 08/06/2017    2:16 PM 01/06/2017    3:00 PM  CBC  WBC 4.0 - 10.5 K/uL 5.7  4.9  5.4   Hemoglobin 12.0 - 15.0 g/dL 12.9  13.5  13.6   Hematocrit 36.0 - 46.0 % 38.6  40.1  39.9   Platelets 150 - 400 K/uL 190  194  199       Latest Ref Rng & Units 02/04/2018    2:05 PM 08/06/2017    2:16 PM 01/06/2017    3:00 PM  CMP  Glucose 65 - 99 mg/dL 99  123  128   BUN 6 - 20 mg/dL 14  10  15    Creatinine 0.44 - 1.00 mg/dL 0.95  1.01  1.03   Sodium 135 - 145 mmol/L 138  137  138   Potassium 3.5 - 5.1 mmol/L 4.0  3.7  3.5   Chloride 101 - 111 mmol/L 102  101  101   CO2 22 - 32 mmol/L 26  26  29   Calcium 8.9 - 10.3 mg/dL 9.7  9.3  9.2   Total Protein 6.5 - 8.1 g/dL 7.4  7.4  6.9   Total Bilirubin 0.3 - 1.2 mg/dL 1.0  0.8  0.8   Alkaline Phos 38 - 126 U/L 59  59  58   AST 15 - 41 U/L 40  57  31   ALT 14 - 54 U/L 64  81  50     DIAGNOSTIC IMAGING:  I have independently reviewed the scans and discussed with the patient. No results found.   ASSESSMENT & PLAN: 1.  Stage Ib right breast cancer, upper inner quadrant (2017) - Mammogram on 03/2016 showed a 1.9 cm irregular spiculated mass in the upper inner quadrant of the right breast. - Pathology of the right breast biopsy showed invasive ductal carcinoma, ER  positive PR positive, HER-2 negative Ki-67 30% and p53 is negative. - Right axillary sentinel node biopsy showed invasive ductal carcinoma 2 cm, sentinel node micrometastatic carcinoma 0.2 mm, total of 3 sentinel nodes 1/3 with micro mets. - Lumpectomy with right axillary sentinel lymph node biopsy on 05/15/2016 - Status post adjuvant XRT. - Oncotype DX on 05/2016 recurrence score of 14, 10-year risk of distant recurrence with tamoxifen alone 9%. - Started on tamoxifen 2017. - Changed to letrozole 2019 status post menopause.  She is tolerating this very well.  - After completing 5 years of hormonal therapy, we discussed discontinuing AI versus BCI testing to determine benefit of extended adjuvant treatment versus opting to stay on letrozole for 10 years.  Patient elected to continue letrozole. - Most recent mammogram (obtained 06/14/2022 at [facility]): BI-RADS Category 2, benign, no significant mammographic findings, stable postlumpectomy change in upper central right breast with surgical clips in place, no evidence of suspicious mass, microcalcifications, or nonsurgical architectural distortion within either breast - Physical exam negative for discrete nodules or masses in either breast.  Right breast had some mild dependent edema and tenderness. - Labs from PCP (06/21/2022): CBC unremarkable.  CMP checked today was at baseline. - No red flag symptoms reported by patient  - PLAN: Continue letrozole to reach goal of 10 years therapy around August 2027 - Continue annual mammogram and labs via gynecologist.    2.  Osteopenia - Last DEXA scan on 02/2018 revealed a T score of -1.5 which is consistent with osteopenia. - Patient stopped taking her calcium and vitamin D about 6 months ago. - Vitamin D level is pending. - PLAN: Patient is due for repeat DEXA scan.  We will order this and notify her of results via telephone.  If medication is needed we will schedule her for follow-up phone  visit to discuss further. - Prescription sent for calcium/vitamin D tablets to be taken twice daily. - Continue weightbearing exercise.   All questions were answered. The patient knows to call the clinic with any problems, questions or concerns.  Medical decision making: Moderate  Time spent on visit: I spent 20 minutes counseling the patient face to face. The total time spent in the appointment was 30 minutes and more than 50% was on counseling.   Harriett Rush, PA-C  07/19/2022 11:09 AM

## 2022-07-19 ENCOUNTER — Other Ambulatory Visit: Payer: BC Managed Care – PPO

## 2022-07-19 ENCOUNTER — Other Ambulatory Visit (HOSPITAL_COMMUNITY)
Admission: RE | Admit: 2022-07-19 | Discharge: 2022-07-19 | Disposition: A | Discharge status: Home / Self Care | Payer: BC Managed Care – PPO | Source: Ambulatory Visit | Attending: Physician Assistant | Admitting: Physician Assistant

## 2022-07-19 ENCOUNTER — Inpatient Hospital Stay: Payer: BC Managed Care – PPO | Attending: Physician Assistant | Admitting: Physician Assistant

## 2022-07-19 VITALS — BP 139/82 | HR 74 | Temp 97.8°F | Resp 16 | Wt 179.5 lb

## 2022-07-19 DIAGNOSIS — Z800 Family history of malignant neoplasm of digestive organs: Secondary | ICD-10-CM | POA: Insufficient documentation

## 2022-07-19 DIAGNOSIS — Z170 Estrogen receptor positive status [ER+]: Secondary | ICD-10-CM | POA: Diagnosis present

## 2022-07-19 DIAGNOSIS — Z87891 Personal history of nicotine dependence: Secondary | ICD-10-CM | POA: Insufficient documentation

## 2022-07-19 DIAGNOSIS — M8580 Other specified disorders of bone density and structure, unspecified site: Secondary | ICD-10-CM | POA: Insufficient documentation

## 2022-07-19 DIAGNOSIS — Z780 Asymptomatic menopausal state: Secondary | ICD-10-CM | POA: Insufficient documentation

## 2022-07-19 DIAGNOSIS — Z79811 Long term (current) use of aromatase inhibitors: Secondary | ICD-10-CM | POA: Insufficient documentation

## 2022-07-19 DIAGNOSIS — Z923 Personal history of irradiation: Secondary | ICD-10-CM | POA: Insufficient documentation

## 2022-07-19 DIAGNOSIS — C50211 Malignant neoplasm of upper-inner quadrant of right female breast: Secondary | ICD-10-CM | POA: Diagnosis not present

## 2022-07-19 DIAGNOSIS — Z9011 Acquired absence of right breast and nipple: Secondary | ICD-10-CM | POA: Insufficient documentation

## 2022-07-19 LAB — COMPREHENSIVE METABOLIC PANEL
ALT: 32 U/L (ref 0–44)
AST: 24 U/L (ref 15–41)
Albumin: 4.6 g/dL (ref 3.5–5.0)
Alkaline Phosphatase: 90 U/L (ref 38–126)
Anion gap: 11 (ref 5–15)
BUN: 12 mg/dL (ref 6–20)
CO2: 31 mmol/L (ref 22–32)
Calcium: 9.9 mg/dL (ref 8.9–10.3)
Chloride: 98 mmol/L (ref 98–111)
Creatinine, Ser: 0.8 mg/dL (ref 0.44–1.00)
GFR, Estimated: >60 mL/min (ref >60)
Glucose, Bld: 102 mg/dL — ABNORMAL HIGH (ref 70–99)
Potassium: 3.8 mmol/L (ref 3.5–5.1)
Sodium: 140 mmol/L (ref 135–145)
Total Bilirubin: 2.3 mg/dL — ABNORMAL HIGH (ref 0.3–1.2)
Total Protein: 8.1 g/dL (ref 6.5–8.1)

## 2022-07-19 LAB — VITAMIN D 25 HYDROXY (VIT D DEFICIENCY, FRACTURES): Vit D, 25-Hydroxy: 37.66 ng/mL (ref 30–100)

## 2022-07-19 MED ORDER — OYSTER SHELL CALCIUM/D3 500-5 MG-MCG PO TABS
1.0000 | ORAL_TABLET | Freq: Two times a day (BID) | ORAL | 3 refills | Status: AC
Start: 2022-07-19 — End: ?

## 2022-07-19 NOTE — Patient Instructions (Signed)
[facility] **   You were seen today by Tarri Abernethy PA-C for your history of breast cancer.    HISTORY OF BREAST CANCER Your most recent mammogram, labs, and physical exam today did not show any signs of returning breast cancer. Continue to take letrozole as prescribed. Continue annual mammograms via your gynecologist's office. We will see you for repeat labs, office visit, and physical exam in 1 year.  OSTEOPENIA You have weakened bones related to your letrozole medication and going through menopause. It is important that you take calcium/vitamin D (prescription sent to pharmacy) twice daily to help improve bone strength. Weightbearing exercises as tolerated. We will check bone density scan and call you to discuss these results.  FOLLOW-UP APPOINTMENT: Office visit in 1 year, or sooner if needed.  ** Thank you for trusting me with your healthcare!  I strive to provide all of my patients with quality care at each visit.  If you receive a survey for this visit, I would be so grateful to you for taking the time to provide feedback.  Thank you in advance!  ~ Aliani Caccavale                   Dr. Derek Jack   &   Tarri Abernethy, PA-C   - - - - - - - - - - - - - - - - - -    Thank you for choosing [facility] to provide your oncology and hematology care.  To afford each patient quality time with our provider, please arrive at least 15 minutes before your scheduled appointment time.   If you have a lab appointment with the [facility] please come in thru the Main Entrance and check in at the main information desk.  You need to re-schedule your appointment should you arrive 10 or more minutes late.  We strive to give you quality time with our providers, and arriving late affects you and other patients whose appointments are after yours.  Also, if you no show three or  more times for appointments you may be dismissed from the clinic at the providers discretion.     Again, thank you for choosing [facility].  Our hope is that these requests will decrease the amount of time that you wait before being seen by our physicians.       _____________________________________________________________  Should you have questions after your visit to [facility], please contact our office at [phone] and follow the prompts.  Our office hours are 8:00 a.m. and 4:30 p.m. Monday - Friday.  Please note that voicemails left after 4:00 p.m. may not be returned until the following business day.  We are closed weekends and major holidays.  You do have access to a nurse 24-7, just call the main number to the clinic [phone] and do not press any options, hold on the line and a nurse will answer the phone.    For prescription refill requests, have your pharmacy contact our office and allow 72 hours.

## 2022-08-02 ENCOUNTER — Other Ambulatory Visit (HOSPITAL_COMMUNITY): Payer: BC Managed Care – PPO

## 2022-08-09 ENCOUNTER — Ambulatory Visit (HOSPITAL_COMMUNITY)
Admission: RE | Admit: 2022-08-09 | Discharge: 2022-08-09 | Disposition: A | Discharge status: Home / Self Care | Payer: BC Managed Care – PPO | Source: Ambulatory Visit | Attending: Physician Assistant | Admitting: Physician Assistant

## 2022-08-09 DIAGNOSIS — Z79811 Long term (current) use of aromatase inhibitors: Secondary | ICD-10-CM | POA: Diagnosis present

## 2022-08-09 DIAGNOSIS — M8580 Other specified disorders of bone density and structure, unspecified site: Secondary | ICD-10-CM | POA: Diagnosis not present

## 2023-06-24 ENCOUNTER — Other Ambulatory Visit: Payer: Self-pay | Admitting: Hematology

## 2023-06-24 DIAGNOSIS — Z170 Estrogen receptor positive status [ER+]: Secondary | ICD-10-CM

## 2023-07-18 ENCOUNTER — Inpatient Hospital Stay: Payer: BC Managed Care – PPO | Attending: Hematology

## 2023-07-18 DIAGNOSIS — M8580 Other specified disorders of bone density and structure, unspecified site: Secondary | ICD-10-CM | POA: Diagnosis not present

## 2023-07-18 DIAGNOSIS — Z79811 Long term (current) use of aromatase inhibitors: Secondary | ICD-10-CM | POA: Diagnosis not present

## 2023-07-18 DIAGNOSIS — C50211 Malignant neoplasm of upper-inner quadrant of right female breast: Secondary | ICD-10-CM | POA: Diagnosis present

## 2023-07-18 DIAGNOSIS — Z170 Estrogen receptor positive status [ER+]: Secondary | ICD-10-CM | POA: Insufficient documentation

## 2023-07-18 DIAGNOSIS — Z800 Family history of malignant neoplasm of digestive organs: Secondary | ICD-10-CM | POA: Insufficient documentation

## 2023-07-18 LAB — COMPREHENSIVE METABOLIC PANEL
ALT: 19 U/L (ref 0–44)
AST: 21 U/L (ref 15–41)
Albumin: 4.3 g/dL (ref 3.5–5.0)
Alkaline Phosphatase: 72 U/L (ref 38–126)
Anion gap: 7 (ref 5–15)
BUN: 12 mg/dL (ref 6–20)
CO2: 29 mmol/L (ref 22–32)
Calcium: 9.2 mg/dL (ref 8.9–10.3)
Chloride: 95 mmol/L — ABNORMAL LOW (ref 98–111)
Creatinine, Ser: 0.73 mg/dL (ref 0.44–1.00)
GFR, Estimated: >60 mL/min (ref >60)
Glucose, Bld: 103 mg/dL — ABNORMAL HIGH (ref 70–99)
Potassium: 3.7 mmol/L (ref 3.5–5.1)
Sodium: 131 mmol/L — ABNORMAL LOW (ref 135–145)
Total Bilirubin: 2.3 mg/dL — ABNORMAL HIGH (ref 0.3–1.2)
Total Protein: 7.5 g/dL (ref 6.5–8.1)

## 2023-07-18 LAB — CBC WITH DIFFERENTIAL/PLATELET
Abs Immature Granulocytes: 0.01 10*3/uL (ref 0.00–0.07)
Basophils Absolute: 0.1 10*3/uL (ref 0.0–0.1)
Basophils Relative: 1 %
Eosinophils Absolute: 0.3 10*3/uL (ref 0.0–0.5)
Eosinophils Relative: 6 %
HCT: 43.4 % (ref 36.0–46.0)
Hemoglobin: 14.6 g/dL (ref 12.0–15.0)
Immature Granulocytes: 0 %
Lymphocytes Relative: 24 %
Lymphs Abs: 1.3 10*3/uL (ref 0.7–4.0)
MCH: 31 pg (ref 26.0–34.0)
MCHC: 33.6 g/dL (ref 30.0–36.0)
MCV: 92.1 fL (ref 80.0–100.0)
Monocytes Absolute: 0.4 10*3/uL (ref 0.1–1.0)
Monocytes Relative: 7 %
Neutro Abs: 3.4 10*3/uL (ref 1.7–7.7)
Neutrophils Relative %: 62 %
Platelets: 227 10*3/uL (ref 150–400)
RBC: 4.71 MIL/uL (ref 3.87–5.11)
RDW: 12 % (ref 11.5–15.5)
WBC: 5.5 10*3/uL (ref 4.0–10.5)
nRBC: 0 % (ref 0.0–0.2)

## 2023-07-18 LAB — VITAMIN D 25 HYDROXY (VIT D DEFICIENCY, FRACTURES): Vit D, 25-Hydroxy: 44.18 ng/mL (ref 30–100)

## 2023-07-18 LAB — LACTATE DEHYDROGENASE: LDH: 146 U/L (ref 98–192)

## 2023-07-25 ENCOUNTER — Inpatient Hospital Stay: Payer: BC Managed Care – PPO | Admitting: Oncology

## 2023-07-25 VITALS — BP 172/92 | HR 82 | Temp 97.7°F | Resp 16 | Wt 203.6 lb

## 2023-07-25 DIAGNOSIS — M8580 Other specified disorders of bone density and structure, unspecified site: Secondary | ICD-10-CM | POA: Diagnosis not present

## 2023-07-25 DIAGNOSIS — Z170 Estrogen receptor positive status [ER+]: Secondary | ICD-10-CM | POA: Diagnosis not present

## 2023-07-25 DIAGNOSIS — C50211 Malignant neoplasm of upper-inner quadrant of right female breast: Secondary | ICD-10-CM

## 2023-07-25 NOTE — Progress Notes (Signed)
[facility] 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   04/19/2016 Pathology Results   [place] pathology-infiltrating ductal carcinoma of right breast.   05/03/2016 Pathology Results   [place] pathology consult- Consult Slide , right breast biopsy - INVASIVE DUCTAL CARCINOMA. ER (95%, strong staining intensity) and PR (95%, strong staining intensity). Her2 is negative (1+). Ki-67 reveals a proliferation rate of 30% and p53 is negative.   05/09/2016 Procedure   Right axillary lymph node biopsy   05/09/2016 Pathology Results   Lymph node, needle/core biopsy, right axillary LYMPHOID TISSUE, NEGATIVE FOR CARCINOMA.   05/15/2016 Surgery   R breast seed guided lumpectomy, R axillary SN biopsy   05/15/2016 Pathology Results   invasive ductal carcinoma 2 cm, sentinel node micrometastatic carcinoma 0.2 mm, total 3 sentinel nodes 1/3 with micromets, ER 95%, PR 95%, Her 2 neu - Ki-67 30% pT1c, pN1MipNX    Radiation Therapy    The patient saw No care team member to display for radiation treatment. This is the current list of radiation treatment: Radiation Treatments        Patient's record has no active or historical radiation treatments documented.           05/21/2016 Oncotype testing   Recurrence score results of 14, 10 year risk of distant recurrence with tamoxifen alone 9%     CANCER STAGING:  Cancer Staging  No matching staging  information was found for the patient.  INTERVAL HISTORY:  Ms. Ameela Lamar, a 57 y.o. female, returns for routine follow-up of her history of stage Ib right-sided breast cancer (2017).  She was last seen in clinic on 07/19/2022 by Rojelio Brenner, PA  In the interim, she had a bilateral diagnostic mammogram on 07/04/2023 in [place].  Impression showed no suspicious findings.  Stable post lumpectomy change in right breast.  Recommend to continue annual screening.  Notes some right medial breast tenderness with palpation intermittently.  Occasional right sided axillary tenderness and swelling.  She denies any changes in her baseline health, hospitalizations or surgeries.  She denies any concerns for recurrent such as a new lump, bone pain, chest pain, dyspnea or abdominal pain.  She continues letrozole daily and is tolerating well without any side effects.  She takes her vitamin D supplements intermittently.  Reports she recently started to lift weights.  Most recent bone density was from 08/09/2022 which shows a T-score of -1.1.  This is considered normal according to the World Health Organization criteria.  Reports appetite and energy levels are 100%.  She denies any pains.   REVIEW OF SYSTEMS:  Review of Systems  Musculoskeletal:  Positive for myalgias (Right breast).    PAST MEDICAL/SURGICAL HISTORY:  Past Medical History:  Diagnosis Date   Breast cancer of upper-outer quadrant of right female breast (HCC) 05/03/2016   Headache    while on BCP   Hypertension    Migraine    Past Surgical History:  Procedure Laterality  Date   CHOLECYSTECTOMY  2011   HERNIA REPAIR  2017   UHR   RADIOACTIVE SEED GUIDED PARTIAL MASTECTOMY WITH AXILLARY SENTINEL LYMPH NODE BIOPSY Right 05/15/2016   Procedure: RADIOACTIVE SEED GUIDED RIGHT BREAST LUMPECTOMY WITH AXILLARY SENTINEL LYMPH NODE BIOPSY;  Surgeon: Emelia Loron, MD;  Location: [facility];  Service: General;  Laterality:  Right;   TUBAL LIGATION     WISDOM TOOTH EXTRACTION      SOCIAL HISTORY:  Social History   Socioeconomic History   Marital status: Married    Spouse name: Not on file   Number of children: Not on file   Years of education: Not on file   Highest education level: Not on file  Occupational History   Not on file  Tobacco Use   Smoking status: Former   Smokeless tobacco: Never  Substance and Sexual Activity   Alcohol use: Yes    Comment: social   Drug use: No   Sexual activity: Yes    Birth control/protection: Surgical    Comment: TL  Other Topics Concern   Not on file  Social History Narrative   Not on file   Social Determinants of Health   Financial Resource Strain: Not on file  Food Insecurity: Not on file  Transportation Needs: Not on file  Physical Activity: Not on file  Stress: Not on file  Social Connections: Not on file  Intimate Partner Violence: Not on file    FAMILY HISTORY:  Family History  Problem Relation Age of Onset   Diabetes Mother    Hypertension Mother    Cirrhosis Mother        NAFLD   Hypertension Father    Cancer Father        colon   Esophageal cancer Neg Hx    Stomach cancer Neg Hx    Colon cancer Neg Hx     CURRENT MEDICATIONS:  Current Outpatient Medications  Medication Sig Dispense Refill   ALPRAZolam (XANAX) 0.25 MG tablet Take 0.25 mg by mouth at bedtime as needed for anxiety.      calcium-vitamin D (OSCAL WITH D) 500-5 MG-MCG tablet Take 1 tablet by mouth 2 (two) times daily. 180 tablet 3   cetirizine (ZYRTEC) 10 MG tablet Take 10 mg by mouth daily.     hydrochlorothiazide (HYDRODIURIL) 25 MG tablet Take 25 mg by mouth daily.      letrozole (FEMARA) 2.5 MG tablet TAKE ONE TABLET BY MOUTH EVERY DAY 30 tablet 11   Magnesium 250 MG TABS Take 250 mg by mouth daily.     Omega-3 Fatty Acids (FISH OIL) 1000 MG CAPS Take 1,000 mg by mouth daily.     omeprazole (PRILOSEC OTC) 20 MG tablet Take 20 mg by mouth as needed.     Probiotic  Product (PROBIOTIC ADVANCED PO) Take by mouth.     valsartan (DIOVAN) 80 MG tablet Take 80 mg by mouth daily.     VITAMIN D PO Take by mouth daily. Nature made Immune -Vitamin A, Ester C, B6, B12, Folate, biotin, magnesium, elderberry, astragalus, echinacea     WEGOVY 1.7 MG/0.75ML SOAJ Inject into the skin.     No current facility-administered medications for this visit.    ALLERGIES:  Allergies  Allergen Reactions   Shrimp [Shellfish Allergy] Hives, Itching and Rash    PHYSICAL EXAM:  Performance status (ECOG): 0 - Asymptomatic  Vitals:   07/25/23 1002  BP: (!) 172/92  Pulse: 82  Resp: 16  Temp: 97.7 F (36.5 C)  SpO2: 100%   Wt Readings from Last 3 Encounters:  07/25/23 203 lb 9.6 oz (92.4 kg)  07/19/22 179 lb 7.3 oz (81.4 kg)  06/25/21 205 lb 14.4 oz (93.4 kg)   Physical Exam Constitutional:      Appearance: Normal appearance.  Cardiovascular:     Rate and Rhythm: Normal rate and regular rhythm.  Pulmonary:     Effort: Pulmonary effort is normal.     Breath sounds: Normal breath sounds.  Chest:  Breasts:    Right: Tenderness present. No inverted nipple, mass or nipple discharge.     Left: Normal. No inverted nipple, mass, nipple discharge or tenderness.       Comments: Tenderness to right upper, medial and lower quadrant of right breast x 2 weeks.  No obvious mass or deformity.  Mild right-sided maxillary tenderness without lymphadenopathy. Abdominal:     General: Bowel sounds are normal.     Palpations: Abdomen is soft.  Musculoskeletal:        General: No swelling. Normal range of motion.  Lymphadenopathy:     Cervical: No cervical adenopathy.     Upper Body:     Right upper body: No supraclavicular or axillary adenopathy.     Left upper body: No supraclavicular or axillary adenopathy.  Neurological:     Mental Status: She is alert and oriented to person, place, and time. Mental status is at baseline.      LABORATORY DATA:  I have reviewed the labs  as listed.     Latest Ref Rng & Units 07/18/2023   10:07 AM 02/04/2018    2:05 PM 08/06/2017    2:16 PM  CBC  WBC 4.0 - 10.5 K/uL 5.5  5.7  4.9   Hemoglobin 12.0 - 15.0 g/dL 28.4  13.2  44.0   Hematocrit 36.0 - 46.0 % 43.4  38.6  40.1   Platelets 150 - 400 K/uL 227  190  194       Latest Ref Rng & Units 07/18/2023   10:07 AM 07/19/2022    9:11 AM 02/04/2018    2:05 PM  CMP  Glucose 70 - 99 mg/dL 102  725  99   BUN 6 - 20 mg/dL 12  12  14    Creatinine 0.44 - 1.00 mg/dL 3.66  4.40  3.47   Sodium 135 - 145 mmol/L 131  140  138   Potassium 3.5 - 5.1 mmol/L 3.7  3.8  4.0   Chloride 98 - 111 mmol/L 95  98  102   CO2 22 - 32 mmol/L 29  31  26    Calcium 8.9 - 10.3 mg/dL 9.2  9.9  9.7   Total Protein 6.5 - 8.1 g/dL 7.5  8.1  7.4   Total Bilirubin 0.3 - 1.2 mg/dL 2.3  2.3  1.0   Alkaline Phos 38 - 126 U/L 72  90  59   AST 15 - 41 U/L 21  24  40   ALT 0 - 44 U/L 19  32  64     DIAGNOSTIC IMAGING:  I have independently reviewed the scans and discussed with the patient. No results found.   ASSESSMENT & PLAN: 1.  Stage Ib right breast cancer, upper inner quadrant (2017) - Mammogram on 03/2016 showed a 1.9 cm irregular spiculated mass in the upper inner quadrant of the right breast. - Pathology of the right breast biopsy showed invasive ductal carcinoma, ER positive PR positive,  HER-2 negative Ki-67 30% and p53 is negative. - Right axillary sentinel node biopsy showed invasive ductal carcinoma 2 cm, sentinel node micrometastatic carcinoma 0.2 mm, total of 3 sentinel nodes 1/3 with micro mets. - Lumpectomy with right axillary sentinel lymph node biopsy on 05/15/2016 - Status post adjuvant XRT. - Oncotype DX on 05/2016 recurrence score of 14, 10-year risk of distant recurrence with tamoxifen alone 9%. - Started on tamoxifen 2017. - Changed to letrozole 2019 status post menopause.  She is tolerating this very well.  -Patient opted to stay on hormonal therapy for total of 10 years.  No BCI testing.   Tolerating letrozole well. -Most recent mammogram from 07/07/2023 did not reveal any suspicious findings.  Stable postlumpectomy changes to the right breast.  Continue annual screenings.  This was completed [facility] [place]. -Physical exam did not reveal any discrete nodules or masses in either breast.  Right breast has some mild dependent edema and tenderness to right upper middle and lower quadrant of the breast. -Discussed referral to the lymphedema clinic.  She was in agreement. -Discussed they will be in touch to set her up for an appointment.  2.  Osteopenia -Bone density from 08/09/2022 showed a T-score of -1.1. -Her vitamin D level was 44.18 on 07/18/2023. -Discussed continuing calcium and vitamin D supplements and will recheck her bone density next September. -Continue weightbearing exercises.  PLAN SUMMARY: >> Continue calcium and vitamin D supplements. >> Referral to lymphedema clinic. >> Discussed returning to clinic in 1 year for follow-up with labs same day (CBC with differential, LDH, CMP and vitamin D level) >> She will have mammogram ordered by OB/GYN.    Medical decision making: Moderate  Time spent on visit: I spent 20 minutes counseling the patient face to face. The total time spent in the appointment was 30 minutes and more than 50% was on counseling.   Mauro Kaufmann, NP  07/19/2022 11:09 AM

## 2024-07-16 ENCOUNTER — Other Ambulatory Visit: Payer: Self-pay

## 2024-07-16 DIAGNOSIS — Z170 Estrogen receptor positive status [ER+]: Secondary | ICD-10-CM

## 2024-07-16 MED ORDER — LETROZOLE 2.5 MG PO TABS
2.5000 mg | ORAL_TABLET | Freq: Every day | ORAL | 11 refills | Status: AC
Start: 2024-07-16 — End: ?

## 2024-07-22 ENCOUNTER — Other Ambulatory Visit: Payer: Self-pay

## 2024-07-22 DIAGNOSIS — Z170 Estrogen receptor positive status [ER+]: Secondary | ICD-10-CM

## 2024-07-23 ENCOUNTER — Inpatient Hospital Stay (HOSPITAL_BASED_OUTPATIENT_CLINIC_OR_DEPARTMENT_OTHER): Payer: BC Managed Care – PPO | Admitting: Oncology

## 2024-07-23 ENCOUNTER — Inpatient Hospital Stay: Payer: BC Managed Care – PPO | Attending: Oncology

## 2024-07-23 VITALS — BP 143/78 | HR 72 | Temp 97.7°F | Resp 16 | Wt 209.7 lb

## 2024-07-23 DIAGNOSIS — Z79811 Long term (current) use of aromatase inhibitors: Secondary | ICD-10-CM | POA: Diagnosis not present

## 2024-07-23 DIAGNOSIS — C50211 Malignant neoplasm of upper-inner quadrant of right female breast: Secondary | ICD-10-CM

## 2024-07-23 DIAGNOSIS — R591 Generalized enlarged lymph nodes: Secondary | ICD-10-CM | POA: Diagnosis not present

## 2024-07-23 DIAGNOSIS — Z170 Estrogen receptor positive status [ER+]: Secondary | ICD-10-CM | POA: Diagnosis not present

## 2024-07-23 DIAGNOSIS — M8580 Other specified disorders of bone density and structure, unspecified site: Secondary | ICD-10-CM | POA: Diagnosis not present

## 2024-07-23 LAB — COMPREHENSIVE METABOLIC PANEL WITH GFR
ALT: 26 U/L (ref 0–44)
AST: 21 U/L (ref 15–41)
Albumin: 4.4 g/dL (ref 3.5–5.0)
Alkaline Phosphatase: 93 U/L (ref 38–126)
Anion gap: 15 (ref 5–15)
BUN: 10 mg/dL (ref 6–20)
CO2: 25 mmol/L (ref 22–32)
Calcium: 9.5 mg/dL (ref 8.9–10.3)
Chloride: 99 mmol/L (ref 98–111)
Creatinine, Ser: 0.77 mg/dL (ref 0.44–1.00)
GFR, Estimated: >60 mL/min (ref >60)
Glucose, Bld: 97 mg/dL (ref 70–99)
Potassium: 3.9 mmol/L (ref 3.5–5.1)
Sodium: 139 mmol/L (ref 135–145)
Total Bilirubin: 1.5 mg/dL — ABNORMAL HIGH (ref 0.0–1.2)
Total Protein: 7.7 g/dL (ref 6.5–8.1)

## 2024-07-23 LAB — CBC WITH DIFFERENTIAL/PLATELET
Abs Immature Granulocytes: 0.03 K/uL (ref 0.00–0.07)
Basophils Absolute: 0.1 K/uL (ref 0.0–0.1)
Basophils Relative: 1 %
Eosinophils Absolute: 0.3 K/uL (ref 0.0–0.5)
Eosinophils Relative: 5 %
HCT: 42.1 % (ref 36.0–46.0)
Hemoglobin: 14.2 g/dL (ref 12.0–15.0)
Immature Granulocytes: 1 %
Lymphocytes Relative: 29 %
Lymphs Abs: 1.7 K/uL (ref 0.7–4.0)
MCH: 31.1 pg (ref 26.0–34.0)
MCHC: 33.7 g/dL (ref 30.0–36.0)
MCV: 92.3 fL (ref 80.0–100.0)
Monocytes Absolute: 0.4 K/uL (ref 0.1–1.0)
Monocytes Relative: 6 %
Neutro Abs: 3.4 K/uL (ref 1.7–7.7)
Neutrophils Relative %: 58 %
Platelets: 252 K/uL (ref 150–400)
RBC: 4.56 MIL/uL (ref 3.87–5.11)
RDW: 12.1 % (ref 11.5–15.5)
WBC: 5.9 K/uL (ref 4.0–10.5)
nRBC: 0 % (ref 0.0–0.2)

## 2024-07-23 LAB — VITAMIN D 25 HYDROXY (VIT D DEFICIENCY, FRACTURES): Vit D, 25-Hydroxy: 40.64 ng/mL (ref 30–100)

## 2024-07-23 LAB — LACTATE DEHYDROGENASE: LDH: 144 U/L (ref 98–192)

## 2024-07-23 NOTE — Assessment & Plan Note (Addendum)
-  Bone density from 08/09/2022 showed a T-score of -1.1. -Recommend bone density at the end of the month. -Her vitamin D  level was 44.18 on 07/18/2023. -Discussed continuing calcium  and vitamin D  supplements. -Continue weightbearing exercises.

## 2024-07-23 NOTE — Assessment & Plan Note (Addendum)
-  Most recent mammogram from 07/07/2023 did not reveal any suspicious findings.  Stable postlumpectomy changes to the right breast.  Continue annual screenings.  This was completed [facility] [place]. -Physical exam did not reveal any discrete nodules or masses in either breast.  Right breast has some mild dependent edema and tenderness to right upper middle and lower quadrant of the breast. -Discussed referral to the lymphedema clinic.  She was in agreement. -Discussed they will be in touch to set her up for an appointment.

## 2024-07-23 NOTE — Progress Notes (Unsigned)
 [facility] OFFICE PROGRESS NOTE  Patient, No Pcp Per  ASSESSMENT & PLAN:    Assessment & Plan Malignant neoplasm of upper-inner quadrant of right breast in female, estrogen receptor positive (HCC) -Most recent mammogram from 07/07/2023 did not reveal any suspicious findings.  Stable postlumpectomy changes to the right breast.  Continue annual screenings.  This was completed [facility] [place]. -Physical exam did not reveal any discrete nodules or masses in either breast.  Right breast has some mild dependent edema and tenderness to right upper middle and lower quadrant of the breast. -Discussed referral to the lymphedema clinic.  She was in agreement. -Discussed they will be in touch to set her up for an appointment. Osteopenia, unspecified location -Bone density from 08/09/2022 showed a T-score of -1.1. -Recommend bone density at the end of the month. -Her vitamin D  level was 44.18 on 07/18/2023. -Discussed continuing calcium  and vitamin D  supplements and will recheck her bone density next September. -Continue weightbearing exercises.  No orders of the defined types were placed in this encounter.   INTERVAL HISTORY: Patient returns for follow-up.  Patient is a 57y.o. female, returns for routine follow-up of her history of stage Ib right-sided breast cancer (2017).    In the interim, she had a bilateral diagnostic mammogram    She denies any changes in her baseline health, hospitalizations or surgeries.   She denies any concerns for recurrent such as a new lump, bone pain, chest pain, dyspnea or abdominal pain.   She continues letrozole  daily and is tolerating well without any side effects.  She takes her vitamin D  supplements intermittently.  Reports she recently started to lift weights.   Most recent bone density was from 08/09/2022 which shows a T-score of -1.1.  This is considered normal according to the World Health Organization criteria.  Recommend repeat at the  end of this month given she is on an AI.    Reports appetite and energy levels are 100%.  She denies any pains.  We reviewed CBC, CMP, LDH and vitamin D .  SUMMARY OF HEMATOLOGIC HISTORY: Oncology History  Breast cancer of upper-inner quadrant of right female breast (HCC)  03/28/2016 Mammogram   1.9 cm irregular spiculated mass in the upper inner quadrant of right breast.   04/19/2016 Pathology Results   [place] [facility]-infiltrating ductal carcinoma of right breast.   05/03/2016 Pathology Results   [place] [facility] consult- Consult [facility]ide , right breast biopsy - INVASIVE DUCTAL CARCINOMA. ER (95%, strong staining intensity) and PR (95%, strong staining intensity). Her2 is negative (1+). Ki-67 reveals a proliferation rate of 30% and p53 is negative.   05/09/2016 Procedure   Right axillary lymph node biopsy   05/09/2016 Pathology Results   Lymph node, needle/core biopsy, right axillary LYMPHOID TISSUE, NEGATIVE FOR CARCINOMA.   05/15/2016 Surgery   R breast seed guided lumpectomy, R axillary SN biopsy   05/15/2016 Pathology Results   invasive ductal carcinoma 2 cm, sentinel node micrometastatic carcinoma 0.2 mm, total 3 sentinel nodes 1/3 with micromets, ER 95%, PR 95%, Her 2 neu - Ki-67 30% pT1c, pN1MipNX    Radiation Therapy    The patient saw No care team member to display for radiation treatment. This is the current list of radiation treatment: Radiation Treatments        Patient's record has no active or historical radiation treatments documented.           05/21/2016 Oncotype testing   Recurrence score results of 14, 10  year risk of distant recurrence with tamoxifen  alone 9%    1.  Stage Ib right breast cancer, upper inner quadrant (2017) - Mammogram on 03/2016 showed a 1.9 cm irregular spiculated mass in the upper inner quadrant of the right breast. - Pathology of the right breast biopsy showed invasive ductal carcinoma, ER positive PR positive, HER-2 negative Ki-67 30% and p53  is negative. - Right axillary sentinel node biopsy showed invasive ductal carcinoma 2 cm, sentinel node micrometastatic carcinoma 0.2 mm, total of 3 sentinel nodes 1/3 with micro mets. - Lumpectomy with right axillary sentinel lymph node biopsy on 05/15/2016 - Status post adjuvant XRT. - Oncotype DX on 05/2016 recurrence score of 14, 10-year risk of distant recurrence with tamoxifen  alone 9%. - Started on tamoxifen  2017. - Changed to letrozole  2019 status post menopause.  She is tolerating this very well.  -Patient opted to stay on hormonal therapy for total of 10 years.  No BCI testing.  Tolerating letrozole  well.  CBC    Component Value Date/Time   WBC 5.5 07/18/2023 1007   RBC 4.71 07/18/2023 1007   HGB 14.6 07/18/2023 1007   HCT 43.4 07/18/2023 1007   PLT 227 07/18/2023 1007   MCV 92.1 07/18/2023 1007   MCH 31.0 07/18/2023 1007   MCHC 33.6 07/18/2023 1007   RDW 12.0 07/18/2023 1007   LYMPHSABS 1.3 07/18/2023 1007   MONOABS 0.4 07/18/2023 1007   EOSABS 0.3 07/18/2023 1007   BASOSABS 0.1 07/18/2023 1007       Latest Ref Rng & Units 07/18/2023   10:07 AM 07/19/2022    9:11 AM 02/04/2018    2:05 PM  CMP  Glucose 70 - 99 mg/dL 896  897  99   BUN 6 - 20 mg/dL 12  12  14    Creatinine 0.44 - 1.00 mg/dL 9.26  9.19  9.04   Sodium 135 - 145 mmol/L 131  140  138   Potassium 3.5 - 5.1 mmol/L 3.7  3.8  4.0   Chloride 98 - 111 mmol/L 95  98  102   CO2 22 - 32 mmol/L 29  31  26    Calcium  8.9 - 10.3 mg/dL 9.2  9.9  9.7   Total Protein 6.5 - 8.1 g/dL 7.5  8.1  7.4   Total Bilirubin 0.3 - 1.2 mg/dL 2.3  2.3  1.0   Alkaline Phos 38 - 126 U/L 72  90  59   AST 15 - 41 U/L 21  24  40   ALT 0 - 44 U/L 19  32  64      No results found for: FERRITIN, VITAMINB12  There were no vitals filed for this visit.  Review of System:  ROS  Physical Exam: Physical Exam Constitutional:      Appearance: Normal appearance.  HENT:     Head: Normocephalic and atraumatic.  Eyes:     Pupils: Pupils  are equal, round, and reactive to light.  Cardiovascular:     Rate and Rhythm: Normal rate and regular rhythm.     Heart sounds: Normal heart sounds. No murmur heard. Pulmonary:     Effort: Pulmonary effort is normal.     Breath sounds: Normal breath sounds. No wheezing.  Abdominal:     General: Bowel sounds are normal. There is no distension.     Palpations: Abdomen is soft.     Tenderness: There is no abdominal tenderness.  Musculoskeletal:        General: Normal  range of motion.     Cervical back: Normal range of motion.  Skin:    General: Skin is warm and dry.     Findings: No rash.  Neurological:     Mental Status: She is alert and oriented to person, place, and time.     Gait: Gait is intact.  Psychiatric:        Mood and Affect: Mood and affect normal.        Cognition and Memory: Memory normal.        Judgment: Judgment normal.      I spent *** minutes dedicated to the care of this patient (face-to-face and non-face-to-face) on the date of the encounter to include what is described in the assessment and plan.,  Delon Hope, NP 07/23/2024 9:31 AM

## 2024-07-25 DIAGNOSIS — R591 Generalized enlarged lymph nodes: Secondary | ICD-10-CM | POA: Insufficient documentation

## 2024-07-25 NOTE — Assessment & Plan Note (Addendum)
 Right sided submental, submandibular and tonsillar lymph nodes over the past 7 to 10 days.  Initially they were tender but they have improved since she has been applying warm compresses and trying massage. Discussed monitoring for now but if no improvement, would recommend soft tissue neck.  Patient understood and will let me know.

## 2024-08-02 ENCOUNTER — Encounter: Payer: Self-pay | Admitting: Oncology

## 2024-08-02 NOTE — Telephone Encounter (Signed)
**Note De-identified  [place] Obfuscation** Please advise 

## 2024-08-03 ENCOUNTER — Other Ambulatory Visit: Payer: Self-pay | Admitting: Oncology

## 2024-08-03 MED ORDER — AMOXICILLIN-POT CLAVULANATE 875-125 MG PO TABS: 1.0000 | ORAL_TABLET | Freq: Two times a day (BID) | ORAL | 0 refills | Status: AC

## 2024-08-03 MED ORDER — PREDNISONE 20 MG PO TABS: 40.0000 mg | ORAL_TABLET | Freq: Every day | ORAL | 0 refills | Status: AC

## 2024-08-03 NOTE — Telephone Encounter (Signed)
 Hey Tomi, I sent her in Augmentin  twice daily x 10 days along with 40 mg prednisone  each morning with breakfast x 5 days.  That will help with the lymph node swelling and tenderness and the Augmentin  will help with the ear infection.  Tell her to let us  know if symptoms are not fully resolved in the next 2 weeks.  Then we can get an ultrasound of her neck.  Delon Hope, NP 08/03/2024 2:54 PM

## 2024-08-20 ENCOUNTER — Ambulatory Visit: Payer: Self-pay | Admitting: Oncology

## 2024-08-20 ENCOUNTER — Ambulatory Visit (HOSPITAL_COMMUNITY)
Admission: RE | Admit: 2024-08-20 | Discharge: 2024-08-20 | Disposition: A | Discharge status: Home / Self Care | Source: Ambulatory Visit | Attending: Oncology | Admitting: Oncology

## 2024-08-20 DIAGNOSIS — M8580 Other specified disorders of bone density and structure, unspecified site: Secondary | ICD-10-CM | POA: Diagnosis present

## 2024-08-23 NOTE — Progress Notes (Signed)
 Patient notified and verbalized understanding.

## 2024-08-25 ENCOUNTER — Other Ambulatory Visit: Payer: Self-pay | Admitting: *Deleted

## 2024-08-25 DIAGNOSIS — R591 Generalized enlarged lymph nodes: Secondary | ICD-10-CM

## 2024-08-25 NOTE — Telephone Encounter (Signed)
 Hey Tomi, lets arrange for her to have an ultrasound soft tissue neck right side if you do not mind.  We do confirm that it is her right side.  I think that is what I remember.  Delon Hope, NP 08/25/2024 8:18 AM

## 2024-08-25 NOTE — Telephone Encounter (Signed)
**Note De-identified  [place] Obfuscation** Please advise 

## 2024-09-03 ENCOUNTER — Ambulatory Visit (HOSPITAL_COMMUNITY)
Admission: RE | Admit: 2024-09-03 | Discharge: 2024-09-03 | Disposition: A | Discharge status: Home / Self Care | Source: Ambulatory Visit | Attending: Oncology | Admitting: Oncology

## 2024-09-03 DIAGNOSIS — R591 Generalized enlarged lymph nodes: Secondary | ICD-10-CM | POA: Diagnosis present

## 2024-09-06 ENCOUNTER — Other Ambulatory Visit: Payer: Self-pay | Admitting: *Deleted

## 2024-09-06 ENCOUNTER — Ambulatory Visit: Payer: Self-pay | Admitting: Oncology

## 2024-09-06 DIAGNOSIS — R591 Generalized enlarged lymph nodes: Secondary | ICD-10-CM

## 2024-09-06 NOTE — Telephone Encounter (Signed)
 I put the order in for US  in 6 weeks.  Please see additional questions from patient.

## 2024-09-10 ENCOUNTER — Inpatient Hospital Stay: Attending: Oncology

## 2024-09-24 ENCOUNTER — Inpatient Hospital Stay

## 2024-10-01 ENCOUNTER — Inpatient Hospital Stay

## 2024-10-18 ENCOUNTER — Ambulatory Visit (HOSPITAL_COMMUNITY)

## 2024-11-08 ENCOUNTER — Encounter: Payer: Self-pay | Admitting: *Deleted

## 2024-11-26 ENCOUNTER — Encounter: Payer: Self-pay | Admitting: Oncology

## 2024-11-26 NOTE — Telephone Encounter (Signed)
 Yes if you do not mind letting her know that we are all full but I could see her on Monday, January 26 as I picked up that day because I am off the following Friday.  Delon Hope, AGNP-C Department of Hematology/Oncology [facility]  Phone: [phone]  11/26/2024 12:26 PM

## 2024-12-03 ENCOUNTER — Inpatient Hospital Stay: Attending: Oncology

## 2024-12-03 DIAGNOSIS — C50211 Malignant neoplasm of upper-inner quadrant of right female breast: Secondary | ICD-10-CM

## 2024-12-03 LAB — CBC WITH DIFFERENTIAL/PLATELET
Abs Immature Granulocytes: 0.03 K/uL (ref 0.00–0.07)
Basophils Absolute: 0.1 K/uL (ref 0.0–0.1)
Basophils Relative: 1 %
Eosinophils Absolute: 0.2 K/uL (ref 0.0–0.5)
Eosinophils Relative: 3 %
HCT: 43.7 % (ref 36.0–46.0)
Hemoglobin: 14.6 g/dL (ref 12.0–15.0)
Immature Granulocytes: 0 %
Lymphocytes Relative: 26 %
Lymphs Abs: 1.8 K/uL (ref 0.7–4.0)
MCH: 30.2 pg (ref 26.0–34.0)
MCHC: 33.4 g/dL (ref 30.0–36.0)
MCV: 90.5 fL (ref 80.0–100.0)
Monocytes Absolute: 0.5 K/uL (ref 0.1–1.0)
Monocytes Relative: 7 %
Neutro Abs: 4.3 K/uL (ref 1.7–7.7)
Neutrophils Relative %: 63 %
Platelets: 318 K/uL (ref 150–400)
RBC: 4.83 MIL/uL (ref 3.87–5.11)
RDW: 12.4 % (ref 11.5–15.5)
WBC: 7 K/uL (ref 4.0–10.5)
nRBC: 0 % (ref 0.0–0.2)

## 2024-12-03 LAB — COMPREHENSIVE METABOLIC PANEL WITH GFR
ALT: 38 U/L (ref 0–44)
AST: 31 U/L (ref 15–41)
Albumin: 4.6 g/dL (ref 3.5–5.0)
Alkaline Phosphatase: 150 U/L — ABNORMAL HIGH (ref 38–126)
Anion gap: 16 — ABNORMAL HIGH (ref 5–15)
BUN: 14 mg/dL (ref 6–20)
CO2: 25 mmol/L (ref 22–32)
Calcium: 10.1 mg/dL (ref 8.9–10.3)
Chloride: 97 mmol/L — ABNORMAL LOW (ref 98–111)
Creatinine, Ser: 0.88 mg/dL (ref 0.44–1.00)
GFR, Estimated: >60 mL/min
Glucose, Bld: 96 mg/dL (ref 70–99)
Potassium: 4.1 mmol/L (ref 3.5–5.1)
Sodium: 138 mmol/L (ref 135–145)
Total Bilirubin: 1.4 mg/dL — ABNORMAL HIGH (ref 0.0–1.2)
Total Protein: 7.7 g/dL (ref 6.5–8.1)

## 2024-12-03 LAB — VITAMIN D 25 HYDROXY (VIT D DEFICIENCY, FRACTURES): Vit D, 25-Hydroxy: 46.9 ng/mL (ref 30–100)

## 2024-12-03 LAB — LACTATE DEHYDROGENASE: LDH: 229 U/L (ref 105–235)

## 2024-12-06 ENCOUNTER — Inpatient Hospital Stay: Admitting: Oncology

## 2024-12-06 DIAGNOSIS — R591 Generalized enlarged lymph nodes: Secondary | ICD-10-CM | POA: Diagnosis not present

## 2024-12-06 NOTE — Assessment & Plan Note (Addendum)
 Right sided submental, submandibular and tonsillar lymph nodes over the past 4 months.  Initially they were tender but they have improved since she has been applying warm compresses and trying massage. She received a round of antibiotics back in September and ultimately had a ultrasound of her neck on 09/03/2024 which showed nonspecific right cervical lymphadenopathy likely reactive.  Recommend short-term follow-up ultrasound to ensure resolution. Reports she has now had 2-3 rounds of antibiotics with improvement of her symptoms but then they ultimately returned. We discussed repeating ultrasound of her right neck with possible biopsy. We did labs on 12/03/2024 which were unremarkable. If able, we will go ahead and schedule her for ultrasound on Friday and a tentative virtual visit the following Friday.

## 2024-12-06 NOTE — Assessment & Plan Note (Deleted)
-  Most recent mammogram from 07/07/2023 did not reveal any suspicious findings.  Stable postlumpectomy changes to the right breast.  Continue annual screenings.  This was completed [facility] [place]. -Physical exam did not reveal any discrete nodules or masses in either breast.  Right breast has some mild dependent edema and tenderness to right upper middle and lower quadrant of the breast. -Discussed referral to the lymphedema clinic.  She was in agreement. -Discussed they will be in touch to set her up for an appointment.

## 2024-12-06 NOTE — Progress Notes (Signed)
 "  [facility] OFFICE PROGRESS NOTE  Patient, No Pcp Per  ASSESSMENT & PLAN:   I connected with Dorthea Dye on 12/06/24 at  1:30 PM EST by telephone visit and verified that I am speaking with the correct person using two identifiers.   I discussed the limitations, risks, security and privacy concerns of performing an evaluation and management service by telemedicine and the availability of in-person appointments. I also discussed with the patient that there may be a patient responsible charge related to this service. The patient expressed understanding and agreed to proceed.   Other persons participating in the visit and their role in the encounter: NP, Patient    Patients location: Home  Providers location: Home  Assessment & Plan Lymphadenopathy Right sided submental, submandibular and tonsillar lymph nodes over the past 4 months.  Initially they were tender but they have improved since she has been applying warm compresses and trying massage. She received a round of antibiotics back in September and ultimately had a ultrasound of her neck on 09/03/2024 which showed nonspecific right cervical lymphadenopathy likely reactive.  Recommend short-term follow-up ultrasound to ensure resolution. Reports she has now had 2-3 rounds of antibiotics with improvement of her symptoms but then they ultimately returned. We discussed repeating ultrasound of her right neck with possible biopsy. We did labs on 12/03/2024 which were unremarkable. If able, we will go ahead and schedule her for ultrasound on Friday and a tentative virtual visit the following Friday.  Orders Placed This Encounter  Procedures   US  SOFT TISSUE NECK    Standing Status:   Future    Expected Date:   12/13/2024    Expiration Date:   12/06/2025    Reason for Exam (SYMPTOM  OR DIAGNOSIS REQUIRED):   Right sided lymph node swelling    Preferred imaging location?:   [facility]    INTERVAL HISTORY: Patient  returns for follow-up for right sided cervical lymphadenopathy.  Reports she has been seen by her dentist on several occasions for same concern and workup to date has been essentially unremarkable.  Most recently, dentist is thinking she may have a salivary stone although she does not have a ton of pain more or less just a swollen sensation and now it is starting to affect her ear.  Her dentist is thinking about sending her to an endodontist.  Thinks there might be a crack in her molar.  She just completed another round of antibiotics last Thursday with penicillin which she thinks made her swelling 80% better.  She denies any fevers.  SUMMARY OF HEMATOLOGIC HISTORY: Oncology History  Breast cancer of upper-inner quadrant of right female breast (HCC)  03/28/2016 Mammogram   1.9 cm irregular spiculated mass in the upper inner quadrant of right breast.   04/19/2016 Pathology Results   [place] pathology-infiltrating ductal carcinoma of right breast.   05/03/2016 Pathology Results   [place] pathology consult- Consult Slide , right breast biopsy - INVASIVE DUCTAL CARCINOMA. ER (95%, strong staining intensity) and PR (95%, strong staining intensity). Her2 is negative (1+). Ki-67 reveals a proliferation rate of 30% and p53 is negative.   05/09/2016 Procedure   Right axillary lymph node biopsy   05/09/2016 Pathology Results   Lymph node, needle/core biopsy, right axillary LYMPHOID TISSUE, NEGATIVE FOR CARCINOMA.   05/15/2016 Surgery   R breast seed guided lumpectomy, R axillary SN biopsy   05/15/2016 Pathology Results   invasive ductal carcinoma 2 cm, sentinel node micrometastatic carcinoma  0.2 mm, total 3 sentinel nodes 1/3 with micromets, ER 95%, PR 95%, Her 2 neu - Ki-67 30% pT1c, pN1MipNX    Radiation Therapy    The patient saw No care team member to display for radiation treatment. This is the current list of radiation treatment: Radiation Treatments        Patient's record has no active or historical  radiation treatments documented.           05/21/2016 Oncotype testing   Recurrence score results of 14, 10 year risk of distant recurrence with tamoxifen  alone 9%      CBC    Component Value Date/Time   WBC 7.0 12/03/2024 1116   RBC 4.83 12/03/2024 1116   HGB 14.6 12/03/2024 1116   HCT 43.7 12/03/2024 1116   PLT 318 12/03/2024 1116   MCV 90.5 12/03/2024 1116   MCH 30.2 12/03/2024 1116   MCHC 33.4 12/03/2024 1116   RDW 12.4 12/03/2024 1116   LYMPHSABS 1.8 12/03/2024 1116   MONOABS 0.5 12/03/2024 1116   EOSABS 0.2 12/03/2024 1116   BASOSABS 0.1 12/03/2024 1116       Latest Ref Rng & Units 12/03/2024   11:16 AM 07/23/2024    9:41 AM 07/18/2023   10:07 AM  CMP  Glucose 70 - 99 mg/dL 96  97  896   BUN 6 - 20 mg/dL 14  10  12    Creatinine 0.44 - 1.00 mg/dL 9.11  9.22  9.26   Sodium 135 - 145 mmol/L 138  139  131   Potassium 3.5 - 5.1 mmol/L 4.1  3.9  3.7   Chloride 98 - 111 mmol/L 97  99  95   CO2 22 - 32 mmol/L 25  25  29    Calcium  8.9 - 10.3 mg/dL 89.8  9.5  9.2   Total Protein 6.5 - 8.1 g/dL 7.7  7.7  7.5   Total Bilirubin 0.0 - 1.2 mg/dL 1.4  1.5  2.3   Alkaline Phos 38 - 126 U/L 150  93  72   AST 15 - 41 U/L 31  21  21    ALT 0 - 44 U/L 38  26  19      No results found for: FERRITIN, VITAMINB12  There were no vitals filed for this visit.  Review of System:  Review of Systems  Constitutional:  Negative for malaise/fatigue and weight loss.  Cardiovascular:  Negative for leg swelling.  Musculoskeletal:  Positive for neck pain. Negative for back pain and joint pain.    Physical Exam: Physical Exam Neurological:     Mental Status: She is alert and oriented to person, place, and time.      I provided 20 minutes of non face-to-face telephone visit time during this encounter, and > 50% was spent counseling as documented under my assessment & plan.   Delon Hope, NP 12/06/2024 1:28 PM "

## 2024-12-10 ENCOUNTER — Encounter: Payer: Self-pay | Admitting: Oncology

## 2024-12-10 ENCOUNTER — Ambulatory Visit (HOSPITAL_COMMUNITY)
Admission: RE | Admit: 2024-12-10 | Discharge: 2024-12-10 | Disposition: A | Source: Ambulatory Visit | Attending: Oncology | Admitting: Oncology

## 2024-12-10 DIAGNOSIS — R591 Generalized enlarged lymph nodes: Secondary | ICD-10-CM | POA: Diagnosis present

## 2024-12-13 ENCOUNTER — Other Ambulatory Visit: Payer: Self-pay | Admitting: Oncology

## 2024-12-13 ENCOUNTER — Ambulatory Visit: Payer: Self-pay | Admitting: Oncology

## 2024-12-13 DIAGNOSIS — R591 Generalized enlarged lymph nodes: Secondary | ICD-10-CM

## 2024-12-13 NOTE — Progress Notes (Signed)
 Enlarged right cervical lymph node.  See previous office visit.  Patient has had intermittently elevated right cervical lymphadenopathy since September 2025.  She has had multiple rounds of antibiotics with improvement of her symptoms but unfortunately symptoms come right back.  Per most recent CT scan, CT-guided biopsy is recommended.  Will get this arranged for her.  Delon Hope, AGNP-C Department of Hematology/Oncology [facility] at [facility]  Phone: [phone]  12/13/2024 11:45 AM

## 2024-12-13 NOTE — Progress Notes (Unsigned)
 Rachael Simmonds, MD  Rachael Villa Cc: [name], RT PROCEDURE / BIOPSY REVIEW Date: 12/13/24  Requested Biopsy site: R neck Reason for request: enlarged LN. No DX, Imaging review: Best seen on US  Neck  Decision: Approved Imaging modality to perform: Ultrasound Schedule with: No sedation / Local anesthetic Schedule for: Any VIR  Additional comments: @Schedulers . US  R cervical LN Bx. Local  Please contact me with questions, concerns, or if issue pertaining to this request arise.  Villa Hughes, MD [facility]       Previous Messages    ----- Message ----- From: Rachael Villa Sent: 12/13/2024   1:06 PM EST To: [name] Rachael; Taryn F Rigney, RT; Ir Proc* Subject: CT BIOPSY                                      Procedure :CT BIOPSY  Reason :Right cervical LN- persistent and enlarged despite antibiotics. Hx of breast cancer Dx: Lymphadenopathy [R59.1 (ICD-10-CM)]    History :US  SOFT TISSUE NECK,DG Bone Density  Provider: Geofm Delon BRAVO, NP  Provider contact ; [phone]

## 2024-12-14 ENCOUNTER — Encounter: Payer: Self-pay | Admitting: Radiology

## 2024-12-14 NOTE — Progress Notes (Signed)
 Rachael Simmonds, MD  Baldwin Channing CROME Cc: [name], RT PROCEDURE / BIOPSY REVIEW Date: 12/13/24  Requested Biopsy site: R neck Reason for request: enlarged LN. No DX, Imaging review: Best seen on US  Neck  Decision: Approved Imaging modality to perform: Ultrasound Schedule with: No sedation / Local anesthetic Schedule for: Any VIR  Additional comments: @Schedulers . US  R cervical LN Bx. Local  Please contact me with questions, concerns, or if issue pertaining to this request arise.  Villa Hughes, MD [facility]iology       Previous Messages    ----- Message ----- From: Baldwin Channing CROME Sent: 12/13/2024   1:06 PM EST To: Channing CROME Baldwin; Taryn F Rigney, RT; Ir Proc* Subject: CT BIOPSY                                      Procedure :CT BIOPSY  Reason :Right cervical LN- persistent and enlarged despite antibiotics. Hx of breast cancer Dx: Lymphadenopathy [R59.1 (ICD-10-CM)]    History :US  SOFT TISSUE NECK,DG Bone Density  Provider: Geofm Delon BRAVO, NP  Provider contact ; [phone]

## 2024-12-15 NOTE — Progress Notes (Signed)
 JUDEEN Delon Hope, AGNP-C Department of Hematology/Oncology [facility] at [facility]  Phone: [phone]  12/15/2024 9:58 AM

## 2024-12-17 ENCOUNTER — Inpatient Hospital Stay: Admitting: Oncology

## 2024-12-17 DIAGNOSIS — R591 Generalized enlarged lymph nodes: Secondary | ICD-10-CM

## 2024-12-17 DIAGNOSIS — C50211 Malignant neoplasm of upper-inner quadrant of right female breast: Secondary | ICD-10-CM

## 2024-12-17 MED ORDER — ALPRAZOLAM 0.25 MG PO TABS: 0.2500 mg | ORAL_TABLET | Freq: Every evening | ORAL | 0 refills | Status: AC | PRN

## 2024-12-17 NOTE — Progress Notes (Signed)
 "  [facility] OFFICE PROGRESS NOTE  Patient, No Pcp Per  I connected with Dorthea Dye on 12/17/24 at  8:40 AM EST by telephone visit and verified that I am speaking with the correct person using two identifiers.   I discussed the limitations, risks, security and privacy concerns of performing an evaluation and management service by telemedicine and the availability of in-person appointments. I also discussed with the patient that there may be a patient responsible charge related to this service. The patient expressed understanding and agreed to proceed.   Other persons participating in the visit and their role in the encounter: NP, Patient    Patients location: Home  Providers location: Clinic   ASSESSMENT & PLAN:  Assessment and Plan Assessment & Plan Enlarged right cervical lymph node, possible metastatic disease Persistent right cervical lymphadenopathy with interval increase in size and decreased vascularity. Metastatic disease is a concern due to breast cancer history, but reactive or inflammatory etiology is possible. - Scheduled ultrasound-guided biopsy of the right cervical lymph node for 01/07/2025. - Offered to move biopsy to an earlier Friday if feasible to accommodate her work schedule. - Advised to report any worsening or new symptoms. - Arranged follow-up visit 10-14 days post-biopsy to review pathology results.  History of right breast cancer, ER/PR positive, HER2 negative ER/PR positive, HER2 negative right breast cancer, status post lumpectomy, low risk of distant recurrence. On adjuvant letrozole , with planned completion in 2027.  - Reviewed oncologic history and risk profile. - Confirmed mammographic surveillance is current (last mammogram August 2025 at [facility]). - Confirmed ongoing letrozole  therapy with planned completion in 2027.  Anxiety disorder Intermittent anxiety related to health concerns and pending biopsy. Uses Xanax   sparingly for acute episodes. - Sent prescription for Xanax  to [facility] pharmacy as requested.   No orders of the defined types were placed in this encounter.   INTERVAL HISTORY: Discussed the use of AI scribe software for clinical note transcription with the patient, who gave verbal consent to proceed.  History of Present Illness Rachael Villa is a 59 year old female with ER/PR positive, HER2 negative right breast cancer on adjuvant letrozole  who presents for evaluation of recurrent right cervical lymphadenopathy.  She was diagnosed with right breast cancer in May 2017 and underwent lumpectomy in July 2017, with final pathology confirming ER/PR positive, HER2 negative disease. Oncotype score was 14. She is currently maintained on adjuvant letrozole , with planned completion in 2027. Surveillance mammogram in August 2025 and laboratory studies in January 2026 were unremarkable.  Over the past several months, she has experienced recurrent swelling of right submental, submandibular, and tonsillar lymph nodes. The lymphadenopathy is tender but not significantly painful, with partial improvement following warm compresses and multiple courses of antibiotics. The most recent course of penicillin reduced swelling by approximately 70%, while prior antibiotics and steroids nearly resolved the swelling. Despite these interventions, the swelling has recurred. She denies fevers and reports that since her most recent ultrasound, the lymphadenopathy has not worsened.  Initial ultrasound in October 2025 demonstrated nonspecific right cervical lymphadenopathy, likely reactive, with recommendation for short-term follow-up. Repeat ultrasound on December 10, 2024 revealed interval enlargement of right cervical lymph nodes, some with decreased vascularity, and the largest lesion increased in size. She has also been evaluated by her dentist for possible salivary stone or cracked molar, but these etiologies have not been  definitively pursued.  She expresses significant anxiety related to the possibility of cancer recurrence and the pending biopsy, which  is exacerbated by immediate access to radiology reports and uncertainty regarding the etiology of her symptoms. She uses Xanax  intermittently to manage acute anxiety, particularly when her thoughts become intrusive or when she needs to remain focused at work. She recently received a prescription refill for Xanax  to bridge until her next primary care visit.     SUMMARY OF HEMATOLOGIC HISTORY: Oncology History  Breast cancer of upper-inner quadrant of right female breast (HCC)  03/28/2016 Mammogram   1.9 cm irregular spiculated mass in the upper inner quadrant of right breast.   04/19/2016 Pathology Results   [place] pathology-infiltrating ductal carcinoma of right breast.   05/03/2016 Pathology Results   [place] pathology consult- Consult Slide , right breast biopsy - INVASIVE DUCTAL CARCINOMA. ER (95%, strong staining intensity) and PR (95%, strong staining intensity). Her2 is negative (1+). Ki-67 reveals a proliferation rate of 30% and p53 is negative.   05/09/2016 Procedure   Right axillary lymph node biopsy   05/09/2016 Pathology Results   Lymph node, needle/core biopsy, right axillary LYMPHOID TISSUE, NEGATIVE FOR CARCINOMA.   05/15/2016 Surgery   R breast seed guided lumpectomy, R axillary SN biopsy   05/15/2016 Pathology Results   invasive ductal carcinoma 2 cm, sentinel node micrometastatic carcinoma 0.2 mm, total 3 sentinel nodes 1/3 with micromets, ER 95%, PR 95%, Her 2 neu - Ki-67 30% pT1c, pN1MipNX    Radiation Therapy    The patient saw No care team member to display for radiation treatment. This is the current list of radiation treatment: Radiation Treatments        Patient's record has no active or historical radiation treatments documented.           05/21/2016 Oncotype testing   Recurrence score results of 14, 10 year risk of distant  recurrence with tamoxifen  alone 9%      CBC    Component Value Date/Time   WBC 7.0 12/03/2024 1116   RBC 4.83 12/03/2024 1116   HGB 14.6 12/03/2024 1116   HCT 43.7 12/03/2024 1116   PLT 318 12/03/2024 1116   MCV 90.5 12/03/2024 1116   MCH 30.2 12/03/2024 1116   MCHC 33.4 12/03/2024 1116   RDW 12.4 12/03/2024 1116   LYMPHSABS 1.8 12/03/2024 1116   MONOABS 0.5 12/03/2024 1116   EOSABS 0.2 12/03/2024 1116   BASOSABS 0.1 12/03/2024 1116       Latest Ref Rng & Units 12/03/2024   11:16 AM 07/23/2024    9:41 AM 07/18/2023   10:07 AM  CMP  Glucose 70 - 99 mg/dL 96  97  896   BUN 6 - 20 mg/dL 14  10  12    Creatinine 0.44 - 1.00 mg/dL 9.11  9.22  9.26   Sodium 135 - 145 mmol/L 138  139  131   Potassium 3.5 - 5.1 mmol/L 4.1  3.9  3.7   Chloride 98 - 111 mmol/L 97  99  95   CO2 22 - 32 mmol/L 25  25  29    Calcium  8.9 - 10.3 mg/dL 89.8  9.5  9.2   Total Protein 6.5 - 8.1 g/dL 7.7  7.7  7.5   Total Bilirubin 0.0 - 1.2 mg/dL 1.4  1.5  2.3   Alkaline Phos 38 - 126 U/L 150  93  72   AST 15 - 41 U/L 31  21  21    ALT 0 - 44 U/L 38  26  19      No results found for: FERRITIN,  VITAMINB12  There were no vitals filed for this visit.  Review of System:  Review of Systems  Constitutional:  Negative for malaise/fatigue.  Endo/Heme/Allergies:        LN to right side of neck  Psychiatric/Behavioral:  The patient is nervous/anxious and has insomnia.     Physical Exam: Physical Exam Neurological:     Mental Status: She is alert and oriented to person, place, and time.      I provided 25 minutes of non face-to-face telephone visit time during this encounter, and > 50% was spent counseling as documented under my assessment & plan.   Delon Hope, NP 12/17/2024 9:15 AM "

## 2025-01-07 ENCOUNTER — Ambulatory Visit (HOSPITAL_COMMUNITY)

## 2025-07-15 ENCOUNTER — Other Ambulatory Visit

## 2025-07-22 ENCOUNTER — Ambulatory Visit: Admitting: Oncology
